# Patient Record
Sex: Male | Born: 1990 | Race: Black or African American | Hispanic: No | Marital: Married | Smoking: Never smoker
Health system: Southern US, Community
[De-identification: ages and names within clinical notes are randomized; demographics above are authoritative.]

---

## 2015-03-28 ENCOUNTER — Emergency Department (INDEPENDENT_AMBULATORY_CARE_PROVIDER_SITE_OTHER)
Admission: EM | Admit: 2015-03-28 | Discharge: 2015-03-28 | Disposition: A | Discharge status: Home / Self Care | Payer: Self-pay | Source: Home / Self Care | Attending: Family Medicine | Admitting: Family Medicine

## 2015-03-28 ENCOUNTER — Encounter (HOSPITAL_COMMUNITY): Payer: Self-pay | Admitting: *Deleted

## 2015-03-28 DIAGNOSIS — K5900 Constipation, unspecified: Secondary | ICD-10-CM

## 2015-03-28 LAB — POCT URINALYSIS DIP (DEVICE)
Bilirubin Urine: NEGATIVE
Glucose, UA: NEGATIVE mg/dL
Hgb urine dipstick: NEGATIVE
Ketones, ur: NEGATIVE mg/dL
Leukocytes, UA: NEGATIVE
Nitrite: NEGATIVE
Protein, ur: NEGATIVE mg/dL
Specific Gravity, Urine: 1.02 (ref 1.005–1.030)
Urobilinogen, UA: 4 mg/dL — ABNORMAL HIGH (ref 0.0–1.0)
pH: 8.5 — ABNORMAL HIGH (ref 5.0–8.0)

## 2015-03-28 MED ORDER — SENNOSIDES-DOCUSATE SODIUM 8.6-50 MG PO TABS: 1.0000 | ORAL_TABLET | Freq: Every evening | ORAL | Status: DC | PRN

## 2015-03-28 NOTE — Discharge Instructions (Signed)

## 2015-03-28 NOTE — ED Provider Notes (Signed)
CSN: [id number]     Arrival date & time 03/28/15  1619 History   First MD Initiated Contact with Patient 03/28/15 1642     Chief Complaint  Patient presents with  . Dysuria   (Consider location/radiation/quality/duration/timing/severity/associated sxs/prior Treatment) Patient is a 24 y.o. male presenting with dysuria. The history is provided by the patient. The history is limited by the absence of a caregiver and a language barrier. A language interpreter was used.  Dysuria This is a new problem. The current episode started more than 2 days ago. The problem occurs constantly. The problem has not changed since onset.Associated symptoms include abdominal pain. Nothing aggravates the symptoms. Nothing relieves the symptoms. He has tried nothing for the symptoms.   this is a 24 year old gentleman, married, who came over to the [place] from [place] 5 days ago. He is complaining about upper abdominal pain, constipation, and he thinks he has a urine problem because his urine is yellow. He has not been with any other sexual partner except his wife.  History reviewed. No pertinent past medical history. History reviewed. No pertinent past surgical history. History reviewed. No pertinent family history. Social History  Substance Use Topics  . Smoking status: None  . Smokeless tobacco: None  . Alcohol Use: No    Review of Systems  Constitutional: Negative.   HENT: Negative.   Eyes: Negative.   Respiratory: Negative.   Cardiovascular: Negative.   Gastrointestinal: Positive for abdominal pain.  Genitourinary: Positive for dysuria.    Allergies  Review of patient's allergies indicates no known allergies.  Home Medications   Prior to Admission medications   Not on File   Meds Ordered and Administered this Visit  Medications - No data to display  There were no vitals taken for this visit. No data found.   Physical Exam  Constitutional: He is oriented to person, place, and time.  He appears well-developed and well-nourished.  HENT:  Head: Normocephalic and atraumatic.  Right Ear: External ear normal.  Left Ear: External ear normal.  Mouth/Throat: Oropharynx is clear and moist.  Eyes: Conjunctivae and EOM are normal. Pupils are equal, round, and reactive to light.  Neck: Normal range of motion. Neck supple.  Cardiovascular: Normal rate, regular rhythm and normal heart sounds.   Pulmonary/Chest: Effort normal and breath sounds normal.  Abdominal: Soft. Bowel sounds are normal. He exhibits no distension. There is no tenderness. There is no rebound and no guarding.  I'm able to palpate the entire abdomen without noticing any grimacing from the patient. When asked if he has pain with palpation, patient does occasionally 0.2 both upper quadrants.  Musculoskeletal: Normal range of motion.  Lymphadenopathy:    He has no cervical adenopathy.  Neurological: He is alert and oriented to person, place, and time.  Skin: Skin is warm and dry.  Psychiatric: He has a normal mood and affect. His behavior is normal.  Nursing note and vitals reviewed.   ED Course  Procedures (including critical care time)  Labs Review Results for orders placed or performed during the hospital encounter of 03/28/15  POCT urinalysis dip (device)  Result Value Ref Range   Glucose, UA NEGATIVE NEGATIVE mg/dL   Bilirubin Urine NEGATIVE NEGATIVE   Ketones, ur NEGATIVE NEGATIVE mg/dL   Specific Gravity, Urine 1.020 1.005 - 1.030   Hgb urine dipstick NEGATIVE NEGATIVE   pH 8.5 (H) 5.0 - 8.0   Protein, ur NEGATIVE NEGATIVE mg/dL   Urobilinogen, UA 4.0 (H) 0.0 - 1.0 mg/dL  Nitrite NEGATIVE NEGATIVE   Leukocytes, UA NEGATIVE NEGATIVE       MDM  Assessment:  Mild dehydration and constipation.  Plan:  Increase fluids, Senokot bid.  Elvina Sidle, MD   Elvina Sidle, MD 03/28/15 1728

## 2015-03-28 NOTE — ED Notes (Signed)
Pt  Reports  Abnormal    Urination  And  Constipation          C/o  Low  abd  Pain    X  5  Days   No         Trouble  Breathing   Pt  Sitting  Upright  On  Exam table  Speaking  In  Complete  sentances    [employer]  Interpretors  Utilized

## 2015-04-13 ENCOUNTER — Telehealth (HOSPITAL_COMMUNITY): Payer: Self-pay

## 2015-05-17 ENCOUNTER — Emergency Department (INDEPENDENT_AMBULATORY_CARE_PROVIDER_SITE_OTHER)
Admission: EM | Admit: 2015-05-17 | Discharge: 2015-05-17 | Disposition: A | Discharge status: Home / Self Care | Payer: Medicaid Other | Source: Home / Self Care | Attending: Emergency Medicine | Admitting: Emergency Medicine

## 2015-05-17 ENCOUNTER — Other Ambulatory Visit (HOSPITAL_COMMUNITY)
Admission: RE | Admit: 2015-05-17 | Discharge: 2015-05-17 | Disposition: A | Discharge status: Home / Self Care | Payer: Medicaid Other | Source: Ambulatory Visit | Attending: Emergency Medicine | Admitting: Emergency Medicine

## 2015-05-17 ENCOUNTER — Encounter (HOSPITAL_COMMUNITY): Payer: Self-pay | Admitting: Emergency Medicine

## 2015-05-17 DIAGNOSIS — R0789 Other chest pain: Secondary | ICD-10-CM

## 2015-05-17 DIAGNOSIS — Z113 Encounter for screening for infections with a predominantly sexual mode of transmission: Secondary | ICD-10-CM | POA: Insufficient documentation

## 2015-05-17 DIAGNOSIS — R0781 Pleurodynia: Secondary | ICD-10-CM | POA: Diagnosis not present

## 2015-05-17 DIAGNOSIS — R8290 Unspecified abnormal findings in urine: Secondary | ICD-10-CM

## 2015-05-17 LAB — POCT I-STAT, CHEM 8
BUN: 6 mg/dL (ref 6–20)
Calcium, Ion: 1.3 mmol/L — ABNORMAL HIGH (ref 1.12–1.23)
Chloride: 100 mmol/L — ABNORMAL LOW (ref 101–111)
Creatinine, Ser: 0.6 mg/dL — ABNORMAL LOW (ref 0.61–1.24)
GLUCOSE: 81 mg/dL (ref 65–99)
HEMATOCRIT: 53 % — AB (ref 39.0–52.0)
HEMOGLOBIN: 18 g/dL — AB (ref 13.0–17.0)
POTASSIUM: 4.2 mmol/L (ref 3.5–5.1)
SODIUM: 141 mmol/L (ref 135–145)
TCO2: 28 mmol/L (ref 0–100)

## 2015-05-17 LAB — POCT URINALYSIS DIP (DEVICE)
BILIRUBIN URINE: NEGATIVE
GLUCOSE, UA: NEGATIVE mg/dL
Hgb urine dipstick: NEGATIVE
Ketones, ur: NEGATIVE mg/dL
LEUKOCYTES UA: NEGATIVE
NITRITE: NEGATIVE
Protein, ur: NEGATIVE mg/dL
Specific Gravity, Urine: 1.015 (ref 1.005–1.030)
UROBILINOGEN UA: 2 mg/dL — AB (ref 0.0–1.0)
pH: 8.5 — ABNORMAL HIGH (ref 5.0–8.0)

## 2015-05-17 MED ORDER — CEFTRIAXONE SODIUM 250 MG IJ SOLR
250.0000 mg | Freq: Once | INTRAMUSCULAR | Status: AC
  Administered 2015-05-17: 250 mg via INTRAMUSCULAR

## 2015-05-17 MED ORDER — LIDOCAINE HCL (PF) 1 % IJ SOLN
INTRAMUSCULAR | Status: AC
  Filled 2015-05-17: qty 5

## 2015-05-17 MED ORDER — CEFTRIAXONE SODIUM 250 MG IJ SOLR
INTRAMUSCULAR | Status: AC
  Filled 2015-05-17: qty 250

## 2015-05-17 MED ORDER — MELOXICAM 15 MG PO TABS: 15.0000 mg | ORAL_TABLET | Freq: Every day | ORAL | Status: DC

## 2015-05-17 MED ORDER — AZITHROMYCIN 250 MG PO TABS
ORAL_TABLET | ORAL | Status: AC
  Filled 2015-05-17: qty 4

## 2015-05-17 MED ORDER — AZITHROMYCIN 250 MG PO TABS
1000.0000 mg | ORAL_TABLET | Freq: Once | ORAL | Status: AC
  Administered 2015-05-17: 1000 mg via ORAL

## 2015-05-17 NOTE — ED Notes (Signed)
C/o left side flank pain and mid chest pain for a week now States urine color is dark Denies any spicy food eating

## 2015-05-17 NOTE — ED Provider Notes (Addendum)
CSN: [id number]     Arrival date & time 05/17/15  1447 History   First MD Initiated Contact with Patient 05/17/15 1545     Chief Complaint  Patient presents with  . Flank Pain  . Chest Pain   (Consider location/radiation/quality/duration/timing/severity/associated sxs/prior Treatment) HPI  He is a 24 year old man here for evaluation of chest pain and right side pain. [place] 2 [place] ago. He declines an interpreter today.  He reports a 2 week history of central chest pain. He states it is present every day. Nothing makes it better or worse. It is not triggered by activity or foods. He does report some palpitations. He denies any shortness of breath. No injury or trauma. He also reports a three-week history of right side pain. It is located over his lateral ribs. It is present only at night when he lays down to sleep. He states it hurts when he tries to change positions. He also reports dark urine for the last several months.  He does report some dysuria.  No fevers or chills. No abdominal pain. No nausea or vomiting.   History reviewed. No pertinent past medical history. History reviewed. No pertinent past surgical history. History reviewed. No pertinent family history. Social History  Substance Use Topics  . Smoking status: None  . Smokeless tobacco: None  . Alcohol Use: No    Review of Systems As in history of present illness Allergies  Review of patient's allergies indicates no known allergies.  Home Medications   Prior to Admission medications   Medication Sig Start Date End Date Taking? Authorizing Provider  meloxicam (MOBIC) 15 MG tablet Take 1 tablet (15 mg total) by mouth daily. For 2 weeks, then as needed for pain 05/17/15   Charm RingsErin J Montay Vanvoorhis, MD  senna-docusate (SENOKOT-S) 8.6-50 MG tablet Take 1 tablet by mouth at bedtime as needed for mild constipation. 03/28/15   Elvina SidleKurt Lauenstein, MD   Meds Ordered and Administered this Visit   Medications  cefTRIAXone  (ROCEPHIN) injection 250 mg (not administered)  azithromycin (ZITHROMAX) tablet 1,000 mg (not administered)    BP 112/81 mmHg  Pulse 64  Temp(Src) 98.6 F (37 C) (Oral)  Resp 16  SpO2 100% No data found.   Physical Exam  Constitutional: He is oriented to person, place, and time. He appears well-developed and well-nourished. No distress.  HENT:  Mouth/Throat: Oropharynx is clear and moist.  Neck: Neck supple.  Cardiovascular: Normal rate and regular rhythm.   No murmur heard. Pulmonary/Chest: Effort normal. No respiratory distress. He has no wheezes. He has no rales. He exhibits no tenderness.  Abdominal: Soft.  No CVA tenderness  Musculoskeletal:  He is tender to palpation along the right lateral ribs  Neurological: He is alert and oriented to person, place, and time.    ED Course  Procedures (including critical care time) ED ECG REPORT   Date: 05/17/2015  Rate: 69  Rhythm: normal sinus rhythm  QRS Axis: normal  Intervals: normal  ST/T Wave abnormalities: normal  Conduction Disutrbances:none  Narrative Interpretation: normal ekg  Old EKG Reviewed: none available  I have personally reviewed the EKG tracing and agree with the computerized printout as noted.   Labs Review Labs Reviewed  POCT URINALYSIS DIP (DEVICE) - Abnormal; Notable for the following:    pH 8.5 (*)    Urobilinogen, UA 2.0 (*)    All other components within normal limits  POCT I-STAT, CHEM 8 - Abnormal; Notable for the following:  Chloride 100 (*)    Creatinine, Ser 0.60 (*)    Calcium, Ion 1.30 (*)    Hemoglobin 18.0 (*)    HCT 53.0 (*)    All other components within normal limits  URINE CULTURE  URINE CYTOLOGY ANCILLARY ONLY    Imaging Review No results found.   MDM   1. Atypical chest pain   2. Rib pain on right side   3. Urine abnormality    History EKG are not concerning for cardiac etiology of chest pain. I suspect musculoskeletal. Rib pain is likely muscle skeletal given  tenderness to palpation. Treat with meloxicam daily for 2 weeks. If no improvement, he will follow-up at the emergency room.  On further history, he revealed that his urinary symptoms started after having sex with a different person. Will treat here with azithromycin and Rocephin. Urine has been sent for culture and cytology.    Charm Rings, MD 05/17/15 1610  Charm Rings, MD 05/17/15 [phone]

## 2015-05-17 NOTE — Discharge Instructions (Signed)
Your EKG shows your heart is normal. The pain in your chest and side is likely coming from the muscles and tendons in your rib cage. Take meloxicam 1 pill daily for 2 weeks. If this does not make it better, please go to the emergency room for additional evaluation.  We gave you some medicines here today to see if that will fix your urine problem.

## 2015-05-18 LAB — URINE CYTOLOGY ANCILLARY ONLY
Chlamydia: NEGATIVE
Neisseria Gonorrhea: NEGATIVE
TRICH (WINDOWPATH): NEGATIVE

## 2015-05-18 LAB — URINE CULTURE: Culture: NO GROWTH

## 2015-05-18 NOTE — ED Notes (Signed)
Final report urine C&S negative for infection. Still waiting for STD testing report

## 2015-05-18 NOTE — ED Notes (Signed)
Final report of STD testing negative 

## 2015-11-25 ENCOUNTER — Encounter (HOSPITAL_COMMUNITY): Payer: Self-pay | Admitting: Emergency Medicine

## 2015-11-25 ENCOUNTER — Ambulatory Visit (INDEPENDENT_AMBULATORY_CARE_PROVIDER_SITE_OTHER): Payer: Medicaid Other

## 2015-11-25 ENCOUNTER — Ambulatory Visit (HOSPITAL_COMMUNITY)
Admission: EM | Admit: 2015-11-25 | Discharge: 2015-11-25 | Disposition: A | Discharge status: Home / Self Care | Payer: Medicaid Other | Attending: Emergency Medicine | Admitting: Emergency Medicine

## 2015-11-25 DIAGNOSIS — R51 Headache: Secondary | ICD-10-CM

## 2015-11-25 DIAGNOSIS — R079 Chest pain, unspecified: Secondary | ICD-10-CM

## 2015-11-25 DIAGNOSIS — R519 Headache, unspecified: Secondary | ICD-10-CM

## 2015-11-25 LAB — POCT URINALYSIS DIP (DEVICE)
GLUCOSE, UA: NEGATIVE mg/dL
Hgb urine dipstick: NEGATIVE
KETONES UR: NEGATIVE mg/dL
Leukocytes, UA: NEGATIVE
NITRITE: NEGATIVE
PH: 7 (ref 5.0–8.0)
Protein, ur: NEGATIVE mg/dL
Specific Gravity, Urine: 1.025 (ref 1.005–1.030)
Urobilinogen, UA: 1 mg/dL (ref 0.0–1.0)

## 2015-11-25 NOTE — Discharge Instructions (Signed)
General Headache Without Cause A headache is pain or discomfort felt around the head or neck area. There are many causes and types of headaches. In some cases, the cause may not be found.  HOME CARE  Managing Pain  Take over-the-counter and prescription medicines only as told by your doctor.  Lie down in a dark, quiet room when you have a headache.  If directed, apply ice to the head and neck area:  Put ice in a plastic bag.  Place a towel between your skin and the bag.  Leave the ice on for 20 minutes, 2-3 times per day.  Use a heating pad or hot shower to apply heat to the head and neck area as told by your doctor.  Keep lights dim if bright lights bother you or make your headaches worse. Eating and Drinking  Eat meals on a regular schedule.  Lessen how much alcohol you drink.  Lessen how much caffeine you drink, or stop drinking caffeine. General Instructions  Keep all follow-up visits as told by your doctor. This is important.  Keep a journal to find out if certain things bring on headaches. For example, write down:  What you eat and drink.  How much sleep you get.  Any change to your diet or medicines.  Relax by getting a massage or doing other relaxing activities.  Lessen stress.  Sit up straight. Do not tighten (tense) your muscles.  Do not use tobacco products. This includes cigarettes, chewing tobacco, or e-cigarettes. If you need help quitting, ask your doctor.  Exercise regularly as told by your doctor.  Get enough sleep. This often means 7-9 hours of sleep. GET HELP IF:  Your symptoms are not helped by medicine.  You have a headache that feels different than the other headaches.  You feel sick to your stomach (nauseous) or you throw up (vomit).  You have a fever. GET HELP RIGHT AWAY IF:   Your headache becomes really bad.  You keep throwing up.  You have a stiff neck.  You have trouble seeing.  You have trouble speaking.  You have  pain in the eye or ear.  Your muscles are weak or you lose muscle control.  You lose your balance or have trouble walking.  You feel like you will pass out (faint) or you pass out.  You have confusion.   This information is not intended to replace advice given to you by your health care provider. Make sure you discuss any questions you have with your health care provider.   Document Released: 03/26/2008 Document Revised: 03/08/2015 Document Reviewed: 10/10/2014 Elsevier Interactive Patient Education 2016 Elsevier Inc[employer] Nonspecific Chest Pain  Chest pain can be caused by many different conditions. There is always a chance that your pain could be related to something serious, such as a heart attack or a blood clot in your lungs. Chest pain can also be caused by conditions that are not life-threatening. If you have chest pain, it is very important to follow up with your health care provider. CAUSES  Chest pain can be caused by:  Heartburn.  Pneumonia or bronchitis.  Anxiety or stress.  Inflammation around your heart (pericarditis) or lung (pleuritis or pleurisy).  A blood clot in your lung.  A collapsed lung (pneumothorax). It can develop suddenly on its own (spontaneous pneumothorax) or from trauma to the chest.  Shingles infection (varicella-zoster virus).  Heart attack.  Damage to the bones, muscles, and cartilage that make up your chest wall.  This can include:  Bruised bones due to injury.  Strained muscles or cartilage due to frequent or repeated coughing or overwork.  Fracture to one or more ribs.  Sore cartilage due to inflammation (costochondritis). RISK FACTORS  Risk factors for chest pain may include:  Activities that increase your risk for trauma or injury to your chest.  Respiratory infections or conditions that cause frequent coughing.  Medical conditions or overeating that can cause heartburn.  Heart disease or family history of heart  disease.  Conditions or health behaviors that increase your risk of developing a blood clot.  Having had chicken pox (varicella zoster). SIGNS AND SYMPTOMS Chest pain can feel like:  Burning or tingling on the surface of your chest or deep in your chest.  Crushing, pressure, aching, or squeezing pain.  Dull or sharp pain that is worse when you move, cough, or take a deep breath.  Pain that is also felt in your back, neck, shoulder, or arm, or pain that spreads to any of these areas. Your chest pain may come and go, or it may stay constant. DIAGNOSIS Lab tests or other studies may be needed to find the cause of your pain. Your health care provider may have you take a test called an ambulatory ECG (electrocardiogram). An ECG records your heartbeat patterns at the time the test is performed. You may also have other tests, such as:  Transthoracic echocardiogram (TTE). During echocardiography, sound waves are used to create a picture of all of the heart structures and to look at how blood flows through your heart.  Transesophageal echocardiogram (TEE).This is a more advanced imaging test that obtains images from inside your body. It allows your health care provider to see your heart in finer detail.  Cardiac monitoring. This allows your health care provider to monitor your heart rate and rhythm in real time.  Holter monitor. This is a portable device that records your heartbeat and can help to diagnose abnormal heartbeats. It allows your health care provider to track your heart activity for several days, if needed.  Stress tests. These can be done through exercise or by taking medicine that makes your heart beat more quickly.  Blood tests.  Imaging tests. TREATMENT  Your treatment depends on what is causing your chest pain. Treatment may include:  Medicines. These may include:  Acid blockers for heartburn.  Anti-inflammatory medicine.  Pain medicine for inflammatory  conditions.  Antibiotic medicine, if an infection is present.  Medicines to dissolve blood clots.  Medicines to treat coronary artery disease.  Supportive care for conditions that do not require medicines. This may include:  Resting.  Applying heat or cold packs to injured areas.  Limiting activities until pain decreases. HOME CARE INSTRUCTIONS  If you were prescribed an antibiotic medicine, finish it all even if you start to feel better.  Avoid any activities that bring on chest pain.  Do not use any tobacco products, including cigarettes, chewing tobacco, or electronic cigarettes. If you need help quitting, ask your health care provider.  Do not drink alcohol.  Take medicines only as directed by your health care provider.  Keep all follow-up visits as directed by your health care provider. This is important. This includes any further testing if your chest pain does not go away.  If heartburn is the cause for your chest pain, you may be told to keep your head raised (elevated) while sleeping. This reduces the chance that acid will go from your stomach into your  esophagus.  Make lifestyle changes as directed by your health care provider. These may include:  Getting regular exercise. Ask your health care provider to suggest some activities that are safe for you.  Eating a heart-healthy diet. A registered dietitian can help you to learn healthy eating options.  Maintaining a healthy weight.  Managing diabetes, if necessary.  Reducing stress. SEEK MEDICAL CARE IF:  Your chest pain does not go away after treatment.  You have a rash with blisters on your chest.  You have a fever. SEEK IMMEDIATE MEDICAL CARE IF:   Your chest pain is worse.  You have an increasing cough, or you cough up blood.  You have severe abdominal pain.  You have severe weakness.  You faint.  You have chills.  You have sudden, unexplained chest discomfort.  You have sudden, unexplained  discomfort in your arms, back, neck, or jaw.  You have shortness of breath at any time.  You suddenly start to sweat, or your skin gets clammy.  You feel nauseous or you vomit.  You suddenly feel light-headed or dizzy.  Your heart begins to beat quickly, or it feels like it is skipping beats. These symptoms may represent a serious problem that is an emergency. Do not wait to see if the symptoms will go away. Get medical help right away. Call your local emergency services (911 in the [place] Do not drive yourself to the hospital.   This information is not intended to replace advice given to you by your health care provider. Make sure you discuss any questions you have with your health care provider.   Document Released: 03/27/2005 Document Revised: 07/08/2014 Document Reviewed: 01/21/2014 Elsevier Interactive Patient Education [employer]

## 2015-11-25 NOTE — ED Provider Notes (Signed)
CSN: [id number]     Arrival date & time 11/25/15  1321 History   First MD Initiated Contact with Patient 11/25/15 1442     Chief Complaint  Patient presents with  . Chest Pain   (Consider location/radiation/quality/duration/timing/severity/associated sxs/prior Treatment) HPI  History obtained from patient:  Pt presents with the cc of:  Headache, chest pain  Duration of symptoms: 2 weeks, or more Treatment prior to arrival: No treatment prior to arrival Context: Patient states that he has had a headache and chest pain off and on for the past couple weeks and possibly a bit longer. He has sought no treatment for this in the past and has not taken any medication. He denies any triggers for the chest pain or the headache. Denies any neurological symptoms at this time. Other symptoms include: None Pain score: 2 FAMILY HISTORY: Not discussed because of language issues      History reviewed. No pertinent past medical history. History reviewed. No pertinent past surgical history. No family history on file. Social History  Substance Use Topics  . Smoking status: Never Smoker   . Smokeless tobacco: None  . Alcohol Use: Yes    Review of Systems  Denies:  NAUSEA, ABDOMINAL PAIN, CHEST PAIN, CONGESTION, DYSURIA, SHORTNESS OF BREATH  Allergies  Review of patient's allergies indicates no known allergies.  Home Medications   Prior to Admission medications   Medication Sig Start Date End Date Taking? Authorizing Provider  meloxicam (MOBIC) 15 MG tablet Take 1 tablet (15 mg total) by mouth daily. For 2 weeks, then as needed for pain 05/17/15   Charm Rings, MD  senna-docusate (SENOKOT-S) 8.6-50 MG tablet Take 1 tablet by mouth at bedtime as needed for mild constipation. 03/28/15   Elvina Sidle, MD   Meds Ordered and Administered this Visit  Medications - No data to display  BP 125/73 mmHg  Pulse 79  Temp(Src) 98.4 F (36.9 C) (Oral)  Resp 18  SpO2 99% No data found.   Physical  Exam NURSES NOTES AND VITAL SIGNS REVIEWED. CONSTITUTIONAL: Well developed, well nourished, no acute distress HEENT: normocephalic, atraumatic EYES: Conjunctiva normal NECK:normal ROM, supple, no adenopathy PULMONARY:No respiratory distress, normal effort ABDOMINAL: Soft, ND, NT BS+, No CVAT MUSCULOSKELETAL: Normal ROM of all extremities,  SKIN: warm and dry without rash PSYCHIATRIC: Mood and affect, behavior are normal  ED Course  Procedures (including critical care time)  Labs Review Labs Reviewed  POCT URINALYSIS DIP (DEVICE) - Abnormal; Notable for the following:    Bilirubin Urine SMALL (*)    All other components within normal limits   I HAVE PERSONALLY REVIEWED TESTS RESULTS WITH PATIENT.  Imaging Review No results found. There is a delay in review of x-rays at this time.  Pt is advised that if there is a finding that requires action he will be contacted.     Visual Acuity Review  Right Eye Distance:   Left Eye Distance:   Bilateral Distance:    Right Eye Near:   Left Eye Near:    Bilateral Near:      I HAVE PERSONALLY REVIEWED ECG AND DISCUSSED FINDINGS WITH PATIENT.   PA UC INTERPRETATION: NORMAL SINUS RHYTHM WITH RATE OF 63, NO AXIS, OR INTERVAL ABNORMALITIES. NO ACUTE CHANGES.  NO OLD ECG TO COMPARE.  ECG UNCHANGED AS COMPARED TO: 11-16    MDM   1. Nonintractable headache, unspecified chronicity pattern, unspecified headache type   2. Chest pain, unspecified chest pain type  Patient is reassured that there are no issues that require transfer to higher level of care at this time or additional tests. Patient is advised to continue home symptomatic treatment. Patient is advised that if there are new or worsening symptoms to attend the emergency department, contact primary care provider, or return to UC. Instructions of care provided discharged home in stable condition.    THIS NOTE WAS GENERATED USING A VOICE RECOGNITION SOFTWARE PROGRAM. ALL  REASONABLE EFFORTS  WERE MADE TO PROOFREAD THIS DOCUMENT FOR ACCURACY.  I have verbally reviewed the discharge instructions with the patient. A printed AVS was given to the patient.  All questions were answered prior to discharge.      Tharon AquasFrank C Shalah Estelle, PA 11/25/15 1606

## 2015-11-25 NOTE — ED Notes (Signed)
Pt c/o constant CP x2 weeks (7/10), constant HA (9/10) and constipation x3 days.... Also reports urine has been more yellowish/amber than usual A&O x4... No acute distress.

## 2016-04-04 ENCOUNTER — Encounter (HOSPITAL_COMMUNITY): Payer: Self-pay | Admitting: Emergency Medicine

## 2016-04-04 ENCOUNTER — Ambulatory Visit (HOSPITAL_COMMUNITY)
Admission: EM | Admit: 2016-04-04 | Discharge: 2016-04-04 | Disposition: A | Discharge status: Home / Self Care | Payer: Medicaid Other | Attending: Internal Medicine | Admitting: Internal Medicine

## 2016-04-04 DIAGNOSIS — N489 Disorder of penis, unspecified: Secondary | ICD-10-CM | POA: Diagnosis not present

## 2016-04-04 DIAGNOSIS — Q809 Congenital ichthyosis, unspecified: Secondary | ICD-10-CM | POA: Diagnosis not present

## 2016-04-04 DIAGNOSIS — N4889 Other specified disorders of penis: Principal | ICD-10-CM

## 2016-04-04 DIAGNOSIS — G8929 Other chronic pain: Secondary | ICD-10-CM

## 2016-04-04 DIAGNOSIS — L299 Pruritus, unspecified: Secondary | ICD-10-CM

## 2016-04-04 MED ORDER — TRIAMCINOLONE ACETONIDE 0.1 % EX CREA: 1.0000 "application " | TOPICAL_CREAM | Freq: Two times a day (BID) | CUTANEOUS | 0 refills | Status: DC

## 2016-04-04 NOTE — Discharge Instructions (Signed)
You may need to see a specialist to understand why you are having pain in your penis.   You have very dry skin you need to use "GOLD BOND" skin therapy lotion.   Use medication as prescribed for you

## 2016-04-04 NOTE — ED Provider Notes (Signed)
CSN: [id number]     Arrival date & time 04/04/16  1456 History   First MD Initiated Contact with Patient 04/04/16 1623     No chief complaint on file.  (Consider location/radiation/quality/duration/timing/severity/associated sxs/prior Treatment) HPI This is not a new problem.  Pt is complaining of pain with urination. This has been an ongoing for over 1 year. He has a burning pain. States that he has pain with sex. There are no alleviating factors. He has pain every day. States that  He also has itching skin that he has used a cream for but it makes his symptoms worse.  No past medical history on file. No past surgical history on file. No family history on file. Social History  Substance Use Topics  . Smoking status: Never Smoker  . Smokeless tobacco: Not on file  . Alcohol use Yes    Review of Systems  Denies: HEADACHE, NAUSEA, ABDOMINAL PAIN, CHEST PAIN, CONGESTION, DYSURIA, SHORTNESS OF BREATH  Allergies  Review of patient's allergies indicates no known allergies.  Home Medications   Prior to Admission medications   Medication Sig Start Date End Date Taking? Authorizing Provider  meloxicam (MOBIC) 15 MG tablet Take 1 tablet (15 mg total) by mouth daily. For 2 weeks, then as needed for pain 05/17/15   Charm RingsErin J Honig, MD  senna-docusate (SENOKOT-S) 8.6-50 MG tablet Take 1 tablet by mouth at bedtime as needed for mild constipation. 03/28/15   Elvina SidleKurt Lauenstein, MD   Meds Ordered and Administered this Visit  Medications - No data to display  BP 115/65 (BP Location: Left Arm)   Pulse 60   Temp 98.6 F (37 C) (Oral)   Resp 12   SpO2 100%  No data found.   Physical Exam NURSES NOTES AND VITAL SIGNS REVIEWED. CONSTITUTIONAL: Well developed, well nourished, no acute distress HEENT: normocephalic, atraumatic EYES: Conjunctiva normal NECK:normal ROM, supple, no adenopathy PULMONARY:No respiratory distress, normal effort ABDOMINAL: Soft, ND, NT BS+, No CVAT MUSCULOSKELETAL:  Normal ROM of all extremities,  SKIN: warm and dry without rash PSYCHIATRIC: Mood and affect, behavior are normal GENITALIA: NORMAL EXTERNAL MALE PENIS AND SCROTUM.  NO VISIBLE OR PALPABLE LESIONS.   Urgent Care Course   Clinical Course    Procedures (including critical care time)  Labs Review Labs Reviewed - No data to display  Imaging Review No results found.   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 Itchy skin   3. Xeroderma      Patient is reassured that there are no issues that require transfer to higher level of care at this time or additional tests. Patient is advised to continue home symptomatic treatment. Patient is advised that if there are new or worsening symptoms to attend the emergency department, contact primary care provider, or return to UC. Instructions of care provided discharged home in stable condition.    THIS NOTE WAS GENERATED USING A VOICE RECOGNITION SOFTWARE PROGRAM. ALL REASONABLE EFFORTS  WERE MADE TO PROOFREAD THIS DOCUMENT FOR ACCURACY.  I have verbally reviewed the discharge instructions with the patient. A printed AVS was given to the patient.  All questions were answered prior to discharge.      Tharon AquasFrank C Patrick, PA 04/04/16 2121

## 2016-04-04 NOTE — ED Triage Notes (Signed)
Patient has burning with urination.  Patient has been seen for the same prior to now at [facility].  Patient also complains of a itchiness to groin area

## 2016-04-04 NOTE — ED Notes (Signed)
Patient and wife are being treated in the same treatment room

## 2016-04-29 ENCOUNTER — Emergency Department (HOSPITAL_COMMUNITY): Payer: Medicaid Other

## 2016-04-29 ENCOUNTER — Emergency Department (HOSPITAL_COMMUNITY)
Admission: EM | Admit: 2016-04-29 | Discharge: 2016-04-29 | Disposition: A | Discharge status: Home / Self Care | Payer: Medicaid Other | Attending: Emergency Medicine | Admitting: Emergency Medicine

## 2016-04-29 ENCOUNTER — Encounter (HOSPITAL_COMMUNITY): Payer: Self-pay

## 2016-04-29 DIAGNOSIS — R0789 Other chest pain: Secondary | ICD-10-CM | POA: Diagnosis not present

## 2016-04-29 DIAGNOSIS — N4889 Other specified disorders of penis: Secondary | ICD-10-CM | POA: Insufficient documentation

## 2016-04-29 DIAGNOSIS — R079 Chest pain, unspecified: Secondary | ICD-10-CM | POA: Diagnosis present

## 2016-04-29 LAB — URINALYSIS, ROUTINE W REFLEX MICROSCOPIC
BILIRUBIN URINE: NEGATIVE
GLUCOSE, UA: NEGATIVE mg/dL
HGB URINE DIPSTICK: NEGATIVE
KETONES UR: NEGATIVE mg/dL
Leukocytes, UA: NEGATIVE
Nitrite: NEGATIVE
PROTEIN: NEGATIVE mg/dL
Specific Gravity, Urine: 1.019 (ref 1.005–1.030)
pH: 8 (ref 5.0–8.0)

## 2016-04-29 LAB — BASIC METABOLIC PANEL
Anion gap: 8 (ref 5–15)
BUN: 5 mg/dL — AB (ref 6–20)
CO2: 21 mmol/L — ABNORMAL LOW (ref 22–32)
CREATININE: 0.46 mg/dL — AB (ref 0.61–1.24)
Calcium: 9.1 mg/dL (ref 8.9–10.3)
Chloride: 109 mmol/L (ref 101–111)
GFR calc Af Amer: >60 mL/min (ref >60)
Glucose, Bld: 80 mg/dL (ref 65–99)
Potassium: 4.4 mmol/L (ref 3.5–5.1)
SODIUM: 138 mmol/L (ref 135–145)

## 2016-04-29 LAB — I-STAT TROPONIN, ED: TROPONIN I, POC: 0 ng/mL (ref 0.00–0.08)

## 2016-04-29 LAB — CBC
HCT: 45.7 % (ref 39.0–52.0)
Hemoglobin: 15.6 g/dL (ref 13.0–17.0)
MCH: 29 pg (ref 26.0–34.0)
MCHC: 34.1 g/dL (ref 30.0–36.0)
MCV: 84.9 fL (ref 78.0–100.0)
PLATELETS: 174 10*3/uL (ref 150–400)
RBC: 5.38 MIL/uL (ref 4.22–5.81)
RDW: 13.5 % (ref 11.5–15.5)
WBC: 3.7 10*3/uL — AB (ref 4.0–10.5)

## 2016-04-29 MED ORDER — NAPROXEN 500 MG PO TABS: 500.0000 mg | ORAL_TABLET | Freq: Two times a day (BID) | ORAL | 0 refills | Status: DC

## 2016-04-29 NOTE — ED Provider Notes (Addendum)
[facility] Provider Note   CSN: [id number] Arrival date & time: 04/29/16  1108     History   Chief Complaint Chief Complaint  Patient presents with  . Chest Pain    HPI Kyle Morris is a 25 y.o. male.  HPI Kyle Morris is a 25 y.o. male presents to emergency department complaining of chest pain and penile pain.  Patient states all symptoms have been going on for months. Pt report chest pain and penile pain during intercourse.  He also states chest pain is tender to the touch.  He has been to the urgent care several times for this, was referred to a primary care doctor, however he states "they do not take Medicaid so I cant follow up."  Patient denies any shortness of breath.  He denies any injuries.  He denies any swelling to his penis or scrotum.  He states he has pain during urination.  He states "my urine looks yellow."  He states his urine is still look white.  He denies any penile discharge.  He denies any abdominal pain, back pain, nausea, vomiting, fever, chills.  He denies any cough.  No recent travel or surgeries.   History reviewed. No pertinent past medical history.  There are no active problems to display for this patient.   History reviewed. No pertinent surgical history.     Home Medications    Prior to Admission medications   Medication Sig Start Date End Date Taking? Authorizing Provider  meloxicam (MOBIC) 15 MG tablet Take 1 tablet (15 mg total) by mouth daily. For 2 weeks, then as needed for pain Patient not taking: Reported on 04/29/2016 05/17/15   Charm RingsErin J Honig, MD  senna-docusate (SENOKOT-S) 8.6-50 MG tablet Take 1 tablet by mouth at bedtime as needed for mild constipation. Patient not taking: Reported on 04/29/2016 03/28/15   Elvina SidleKurt Lauenstein, MD  triamcinolone cream (KENALOG) 0.1 % Apply 1 application topically 2 (two) times daily. Patient not taking: Reported on 04/29/2016 04/04/16   Tharon AquasFrank C Patrick, PA    Family History No  family history on file.  Social History Social History  Substance Use Topics  . Smoking status: Never Smoker  . Smokeless tobacco: Never Used  . Alcohol use Yes     Allergies   Review of patient's allergies indicates no known allergies.   Review of Systems Review of Systems  Constitutional: Negative for chills and fever.  Respiratory: Negative for cough, chest tightness and shortness of breath.   Cardiovascular: Positive for chest pain. Negative for palpitations and leg swelling.  Gastrointestinal: Negative for abdominal distention, abdominal pain, diarrhea, nausea and vomiting.  Genitourinary: Positive for penile pain. Negative for dysuria, frequency, hematuria and urgency.  Musculoskeletal: Negative for arthralgias, myalgias, neck pain and neck stiffness.  Skin: Negative for rash.  Allergic/Immunologic: Negative for immunocompromised state.  Neurological: Negative for dizziness, weakness, light-headedness, numbness and headaches.  All other systems reviewed and are negative.    Physical Exam Updated Vital Signs BP 107/81   Pulse (!) 57   Temp 98 F (36.7 C)   Resp 11   SpO2 100%   Physical Exam  Constitutional: He appears well-developed and well-nourished. No distress.  HENT:  Head: Normocephalic and atraumatic.  Eyes: Conjunctivae are normal.  Neck: Neck supple.  Cardiovascular: Normal rate, regular rhythm and normal heart sounds.   Pulmonary/Chest: Effort normal. No respiratory distress. He has no wheezes. He has no rales. He exhibits tenderness.  Diffuse tenderness to palpation  Abdominal: Soft.  Bowel sounds are normal. He exhibits no distension. There is no tenderness. There is no rebound.  Genitourinary: Penis normal. No penile tenderness.  Genitourinary Comments: Normal scrotum bilaterally  Musculoskeletal: He exhibits no edema.  Neurological: He is alert.  Skin: Skin is warm and dry.  Nursing note and vitals reviewed.    ED Treatments / Results    Labs (all labs ordered are listed, but only abnormal results are displayed) Labs Reviewed  BASIC METABOLIC PANEL - Abnormal; Notable for the following:       Result Value   CO2 21 (*)    BUN 5 (*)    Creatinine, Ser 0.46 (*)    All other components within normal limits  CBC - Abnormal; Notable for the following:    WBC 3.7 (*)    All other components within normal limits  URINALYSIS, ROUTINE W REFLEX MICROSCOPIC (NOT AT [facility])  I-STAT TROPOININ, ED  GC/CHLAMYDIA PROBE AMP ([place]) NOT AT [facility]    EKG  EKG Interpretation  Date/Time:  Monday April 29 2016 11:15:05 EDT Ventricular Rate:  79 PR Interval:  150 QRS Duration: 80 QT Interval:  336 QTC Calculation: 385 R Axis:   74 Text Interpretation:  Normal sinus rhythm Normal ECG Confirmed by HAVILAND MD, JULIE ([id number]) on 04/29/2016 12:18:40 PM       Radiology Dg Chest 2 View  Result Date: 04/29/2016 CLINICAL DATA:  Chest pain EXAM: CHEST  2 VIEW COMPARISON:  5/ 27/17 FINDINGS: The heart size and mediastinal contours are within normal limits. Both lungs are clear. The visualized skeletal structures are unremarkable. IMPRESSION: No active cardiopulmonary disease. Electronically Signed   By: Natasha MeadLiviu  Pop M.D.   On: 04/29/2016 12:03    Procedures Procedures (including critical care time)  Medications Ordered in ED Medications - No data to display   Initial Impression / Assessment and Plan / ED Course  I have reviewed the triage vital signs and the nursing notes.  Pertinent labs & imaging results that were available during my care of the patient were reviewed by me and considered in my medical decision making (see chart for details).  Clinical Course   Pt in ED with recurrent chronic chest wall pain. He is in nad. Chest pain reproducible. [facility] Will check basic labs, CXR. PERC negatie, doubt PE. Normal VS.   Pt also complaining of pain to his penis. This is also a chronic  complaint. Pt reports dysuria. Pain with intercourse. Will chec UA and GC/Chlamidia sent.   2:36 PM Pt's chest x-ray is negative.  Most likely chest wall pain.  Doubt PE or ACS. His genital exam is normal with no penile lesions, discharge, no scrotal pain or swelling at this time. Pt continues to complain of pain to his penis.  Will refer to urology. Naprosyn for pain and inflammation of his chest wall pain.   Follow up with PCP.   Vitals:   04/29/16 1112 04/29/16 1230 04/29/16 1245 04/29/16 1330  BP: 132/88 105/81 106/79 107/81  Pulse: 75 (!) 58 64 (!) 57  Resp: 17 13 11 11   Temp: 98 F (36.7 C)     SpO2: 100% 100% 100% 100%     Final Clinical Impressions(s) / ED Diagnoses   Final diagnoses:  Chest wall pain  Penile pain    New Prescriptions New Prescriptions   No medications on file     Jaynie Crumbleatyana Tali Cleaves, PA-C 05/01/16 [id number]  Jacalyn Lefevre, MD 05/01/16 1554    Jaynie Crumble, PA-C 05/23/16 1204    Jacalyn Lefevre, MD 05/24/16 [phone]

## 2016-04-29 NOTE — ED Notes (Signed)
Patient undressed, in gown, on monitor, continuous pulse oximetry and blood pressure cuff 

## 2016-04-29 NOTE — Discharge Instructions (Signed)
Naprosyn for chest wall pain. Follow up with primary care doctor. Please follow up with urology for further evaluation of your penile pain

## 2016-04-29 NOTE — ED Notes (Signed)
PA at bedside.

## 2016-04-29 NOTE — ED Triage Notes (Addendum)
Per pt, Pt is coming from home with complaints of central chest pain that started two weeks ago. Pt reports burning chest pain with some intermittent sharp pains. Complains of headache intermittently. 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 Was not able to see specialist due to insurance.

## 2016-04-29 NOTE — ED Notes (Signed)
Pt.  Verbalize understanding of discharge teaching and need to follow up. Pt. States understanding of primary/ specialty doctor number to call for follow up.

## 2016-04-30 LAB — GC/CHLAMYDIA PROBE AMP (~~LOC~~) NOT AT ARMC
Chlamydia: NEGATIVE
NEISSERIA GONORRHEA: NEGATIVE

## 2017-05-21 ENCOUNTER — Encounter (HOSPITAL_COMMUNITY): Payer: Self-pay | Admitting: Emergency Medicine

## 2017-05-21 ENCOUNTER — Ambulatory Visit (INDEPENDENT_AMBULATORY_CARE_PROVIDER_SITE_OTHER): Payer: Medicaid Other

## 2017-05-21 ENCOUNTER — Ambulatory Visit (HOSPITAL_COMMUNITY)
Admission: EM | Admit: 2017-05-21 | Discharge: 2017-05-21 | Disposition: A | Discharge status: Home / Self Care | Payer: Medicaid Other | Attending: Family Medicine | Admitting: Family Medicine

## 2017-05-21 DIAGNOSIS — J181 Lobar pneumonia, unspecified organism: Secondary | ICD-10-CM

## 2017-05-21 DIAGNOSIS — J189 Pneumonia, unspecified organism: Secondary | ICD-10-CM

## 2017-05-21 MED ORDER — IBUPROFEN 800 MG PO TABS
ORAL_TABLET | ORAL | Status: AC
  Filled 2017-05-21: qty 1

## 2017-05-21 MED ORDER — HYDROCODONE-HOMATROPINE 5-1.5 MG/5ML PO SYRP: 5.0000 mL | ORAL_SOLUTION | Freq: Four times a day (QID) | ORAL | 0 refills | Status: DC | PRN

## 2017-05-21 MED ORDER — IBUPROFEN 800 MG PO TABS
800.0000 mg | ORAL_TABLET | Freq: Once | ORAL | Status: AC
  Administered 2017-05-21: 800 mg via ORAL

## 2017-05-21 MED ORDER — AZITHROMYCIN 250 MG PO TABS: 250.0000 mg | ORAL_TABLET | Freq: Every day | ORAL | 0 refills | Status: DC

## 2017-05-21 NOTE — Discharge Instructions (Signed)
Return in two days for re-evaluation, sooner if needed. If your symptoms worsen you should go to the Emergency Department.

## 2017-05-21 NOTE — ED Provider Notes (Signed)
[facility]   [id number] 05/21/17 Arrival Time: 1733  ASSESSMENT & PLAN:  1. Pneumonia of right middle lobe due to infectious organism [facility](HCC)     Meds ordered this encounter  Medications  . ibuprofen (ADVIL,MOTRIN) tablet 800 mg  . azithromycin (ZITHROMAX) 250 MG tablet    Sig: Take 1 tablet (250 mg total) by mouth daily. Take first 2 tablets together, then 1 every day until finished.    Dispense:  6 tablet    Refill:  0  . HYDROcodone-homatropine (HYCODAN) 5-1.5 MG/5ML syrup    Sig: Take 5 mLs by mouth every 6 (six) hours as needed for cough.    Dispense:  90 mL    Refill:  0   To return in 48 hours for recheck, sooner if needed. Feeling better after ibuprofen given. Medication sedation precautions given.  Reviewed expectations re: course of current medical issues. Questions answered. Outlined signs and symptoms indicating need for more acute intervention. Patient verbalized understanding. After Visit Summary given.   SUBJECTIVE:  Kyle Morris is a 26 y.o. male who presents with complaint of nasal congestion, post-nasal drainage, and a persistent cough. Body aches. Onset abrupt approximately 2-3 weeks ago. Overall fatigued. SOB: mild. Wheezing: none. Subjective fever. Tylenol with mild relief. Occasional post-tussive emesis. Decreased appetite. Tolerating PO intake. No h/o pneumonia or TB. Non-smoker.  ROS: As per HPI.   OBJECTIVE:  Vitals:   05/21/17 1804  BP: 112/66  Pulse: (!) 102  Resp: 20  Temp: (!) 102.5 F (39.2 C)  TempSrc: Oral  SpO2: 97%    Febrile. General appearance: alert; no distress but appears very fatigued HEENT: nasal congestion; clear runny nose; throat irritation secondary to post-nasal drainage Neck: supple without LAD Lungs: coarse breath sounds bilaterally; no wheezing; no respiratory distress Skin: warm and dry Psychological: alert and cooperative; normal mood and affect    Labs Reviewed - No data to display  Dg  Chest 2 View  Result Date: 05/21/2017 CLINICAL DATA:  Per pt: sick for three weeks, cough, fever now is 102.5, chest pain. Non-smoker. No history of respiratory or cardiac disease. No HBP or diabetes. EXAM: CHEST  2 VIEW COMPARISON:  04/29/2016 FINDINGS: Midline trachea. Normal heart size and mediastinal contours. No pleural effusion or pneumothorax. Right middle lobe and probable right lower lobe airspace disease. Clear left lung. IMPRESSION: Right middle lobe and probable right lower lobe airspace disease, most consistent with pneumonia. Electronically Signed   By: Jeronimo GreavesKyle  Talbot M.D.   On: 05/21/2017 19:01    No Known Allergies   Social History   Socioeconomic History  . Marital status: Married    Spouse name: Not on file  . Number of children: Not on file  . Years of education: Not on file  . Highest education level: Not on file  Social Needs  . Financial resource strain: Not on file  . Food insecurity - worry: Not on file  . Food insecurity - inability: Not on file  . Transportation needs - medical: Not on file  . Transportation needs - non-medical: Not on file  Occupational History  . Not on file  Tobacco Use  . Smoking status: Never Smoker  . Smokeless tobacco: Never Used  Substance and Sexual Activity  . Alcohol use: Yes  . Drug use: Not on file  . Sexual activity: Not on file  Other Topics Concern  . Not on file  Social History Narrative  . Not on file  Mardella LaymanHagler, Braelyn Jenson, MD 05/21/17 Ernestina [id number]

## 2017-05-21 NOTE — ED Triage Notes (Signed)
PT C/O: cold sx  ONSET: 3 weeks  SX INCLUDE: prod cough, HA, chest pain due to cough, ribcage pain, fevers  DENIES:   TAKING MEDS: none  A&O x4... NAD... Ambulatory

## 2017-05-23 ENCOUNTER — Encounter (HOSPITAL_COMMUNITY): Payer: Self-pay | Admitting: Emergency Medicine

## 2017-05-23 ENCOUNTER — Ambulatory Visit (HOSPITAL_COMMUNITY)
Admission: EM | Admit: 2017-05-23 | Discharge: 2017-05-23 | Disposition: A | Discharge status: Home / Self Care | Payer: Medicaid Other | Attending: Physician Assistant | Admitting: Physician Assistant

## 2017-05-23 DIAGNOSIS — J181 Lobar pneumonia, unspecified organism: Secondary | ICD-10-CM

## 2017-05-23 DIAGNOSIS — J189 Pneumonia, unspecified organism: Secondary | ICD-10-CM

## 2017-05-23 MED ORDER — NAPROXEN 500 MG PO TABS: 500.0000 mg | ORAL_TABLET | Freq: Two times a day (BID) | ORAL | 0 refills | Status: AC

## 2017-05-23 NOTE — ED Provider Notes (Addendum)
05/23/2017 8:11 PM   DOB: 09/24/1990 / MRN: [id number]  SUBJECTIVE:  Kyle Morris is a 26 y.o. male presenting for recheck of pneumonia. Tells me his is feeling a lot better but continues to have pain in his chest, mostly with activity.  Did have some rigor last time he was here but this is gone. He is also breathing better. Was advised to come back today per Dr. Tracie HarrierHagler for a recheck.   He has No Known Allergies.   He  has no past medical history on file.    He  reports that  has never smoked. he has never used smokeless tobacco. He reports that he drinks alcohol. He  has no sexual activity history on file. The patient  has no past surgical history on file.  His Family history is unknown by patient.  Review of Systems  Constitutional: Negative for chills and fever.  Cardiovascular: Negative for chest pain.  Gastrointestinal: Negative for nausea.  Skin: Negative for itching and rash.  Neurological: Negative for dizziness.    OBJECTIVE:  BP (!) 102/57 (BP Location: Left Arm)   Pulse 85   Temp 99.8 F (37.7 C) (Oral)   Resp 20   SpO2 100%   Wt Readings from Last 3 Encounters:  No data found for Wt   Temp Readings from Last 3 Encounters:  05/23/17 99.8 F (37.7 C) (Oral)  05/21/17 (!) 102.5 F (39.2 C) (Oral)  04/29/16 98 F (36.7 C)   BP Readings from Last 3 Encounters:  05/23/17 (!) 102/57  05/21/17 112/66  04/29/16 105/78   Pulse Readings from Last 3 Encounters:  05/23/17 85  05/21/17 (!) 102  04/29/16 62     Physical Exam  Constitutional: He appears well-developed and well-nourished. No distress.  Cardiovascular: Normal rate and regular rhythm.  Pulmonary/Chest: Effort normal and breath sounds normal. No respiratory distress. He has no wheezes. He has no rales. He exhibits no tenderness.  Skin: He is not diaphoretic.    No results found for this or any previous visit (from the past 72 hour(s)).  No results found.  ASSESSMENT AND PLAN:  The  encounter diagnosis was Pneumonia of right middle lobe due to infectious organism [facility](HCC). His vitals are improving.  He is non toxic in appearance.  He will continue hycodan and Z-pack.  Will add naprosyn and write a note for work, as he has been trying to work through his symptoms. He will come back on Monday if he feels that his improvement is stalling.    The patient is advised to call or return to clinic if he does not see an improvement in symptoms, or to seek the care of the closest emergency department if he worsens with the above plan.   Deliah BostonMichael Clark, MHS, PA-C 05/23/2017 8:11 PM        Ofilia Neaslark, Michael L, PA-C 05/23/17 2101

## 2017-05-23 NOTE — ED Triage Notes (Signed)
PT C/O: here for a f/u.... Seen here on 05/21/17 for pneumonia ... Reports getting better but still having chest pain/rib pain   ONSET: 3 weeks  SX ALSO INCLUDE: cough, mild HA  DENIES: fevers,   TAKING MEDS: Azithromycin, Naproxen   A&O x4... NAD... Ambulatory

## 2017-05-23 NOTE — Discharge Instructions (Signed)
Keep taking your cough syrup and antibiotic.  I am adding naprosyn to you regimen. If you are still feeling as poorly on Monday then I want you to come back here. Come back anytime if you are getting worse.

## 2017-05-30 ENCOUNTER — Ambulatory Visit (HOSPITAL_COMMUNITY)
Admission: EM | Admit: 2017-05-30 | Discharge: 2017-05-30 | Disposition: A | Discharge status: Home / Self Care | Payer: Medicaid Other | Attending: Family Medicine | Admitting: Family Medicine

## 2017-05-30 ENCOUNTER — Encounter (HOSPITAL_COMMUNITY): Payer: Self-pay | Admitting: Emergency Medicine

## 2017-05-30 ENCOUNTER — Other Ambulatory Visit: Payer: Self-pay

## 2017-05-30 DIAGNOSIS — K625 Hemorrhage of anus and rectum: Secondary | ICD-10-CM

## 2017-05-30 LAB — POCT I-STAT, CHEM 8
BUN: 5 mg/dL — AB (ref 6–20)
CHLORIDE: 103 mmol/L (ref 101–111)
CREATININE: 0.5 mg/dL — AB (ref 0.61–1.24)
Calcium, Ion: 1.17 mmol/L (ref 1.15–1.40)
Glucose, Bld: 74 mg/dL (ref 65–99)
HEMATOCRIT: 44 % (ref 39.0–52.0)
HEMOGLOBIN: 15 g/dL (ref 13.0–17.0)
POTASSIUM: 3.8 mmol/L (ref 3.5–5.1)
Sodium: 140 mmol/L (ref 135–145)
TCO2: 28 mmol/L (ref 22–32)

## 2017-05-30 MED ORDER — DOCUSATE SODIUM 100 MG PO CAPS
100.0000 mg | ORAL_CAPSULE | Freq: Every day | ORAL | 0 refills | Status: DC
Start: 2017-05-30 — End: 2020-06-16

## 2017-05-30 NOTE — ED Provider Notes (Signed)
[facility]    CSN: [id number] Arrival date & time: 05/30/17  1228     History   Chief Complaint Chief Complaint  Patient presents with  . Rectal Bleeding  . Back Pain    HPI Kyle Morris is a 26 y.o. male.   Kyle Morris presents with complaints of two episodes of blood in his stool. Yesterday and this morning he noted bright red blood. States he felt like he had to sit on the toilet longer than normal for him. This morning he felt dizzy. Has back pain as well, rates it 6/10. Denies abdominal pain, but states he has noticed intermittent abdominal pain. Denies constipation. Denies any previous similar episodes. He was recently treated for pneumonia, states he feels much improved. Last dose of naproxen yesterday. Decreased appetite but has been eating and drinking. Denies blood to urine. Without any other known medical history. Denies nausea or vomiting.    ROS per HPI.       History reviewed. No pertinent past medical history.  There are no active problems to display for this patient.   History reviewed. No pertinent surgical history.     Home Medications    Prior to Admission medications   Medication Sig Start Date End Date Taking? Authorizing Provider  docusate sodium (COLACE) 100 MG capsule Take 1 capsule (100 mg total) by mouth daily. 05/30/17   Georgetta HaberBurky, Natalie B, NP  HYDROcodone-homatropine (HYCODAN) 5-1.5 MG/5ML syrup Take 5 mLs by mouth every 6 (six) hours as needed for cough. 05/21/17   Mardella LaymanHagler, Brian, MD  naproxen (NAPROSYN) 500 MG tablet Take 1 tablet (500 mg total) by mouth 2 (two) times daily with a meal for 10 days. Avoid NSAID medications while taking this. 05/23/17 06/02/17  Ofilia Neaslark, Michael L, PA-C    Family History Family History  Family history unknown: Yes    Social History Social History   Tobacco Use  . Smoking status: Never Smoker  . Smokeless tobacco: Never Used  Substance Use Topics  . Alcohol use: Yes  . Drug use: Not  on file     Allergies   Patient has no known allergies.   Review of Systems Review of Systems   Physical Exam Triage Vital Signs ED Triage Vitals  Enc Vitals Group     BP 05/30/17 1304 116/81     Pulse Rate 05/30/17 1304 69     Resp 05/30/17 1304 18     Temp 05/30/17 1304 98.3 F (36.8 C)     Temp src --      SpO2 05/30/17 1304 100 %     Weight --      Height --      Head Circumference --      Peak Flow --      Pain Score 05/30/17 1305 6     Pain Loc --      Pain Edu? --      Excl. in GC? --    No data found.  Updated Vital Signs BP 116/81   Pulse 69   Temp 98.3 F (36.8 C)   Resp 18   SpO2 100%   Visual Acuity Right Eye Distance:   Left Eye Distance:   Bilateral Distance:    Right Eye Near:   Left Eye Near:    Bilateral Near:     Physical Exam  Constitutional: He is oriented to person, place, and time. He appears well-developed and well-nourished.  Cardiovascular: Normal rate and regular rhythm.  Pulmonary/Chest: Effort  normal and breath sounds normal.  Abdominal: Soft. Normal appearance. There is generalized tenderness. There is no rigidity, no rebound, no guarding, no CVA tenderness, no tenderness at McBurney's point and negative Murphy's sign.  Inconsistent abdominal pain with palpation, at times to LLQ, and at times to epigastric  Genitourinary: Rectum normal. Rectal exam shows no external hemorrhoid and anal tone normal.  Genitourinary Comments: Without any stool or blood on gross exam; without hemorrhoids or visible fissure   Neurological: He is alert and oriented to person, place, and time.  Skin: Skin is warm and dry.  Vitals reviewed.    UC Treatments / Results  Labs (all labs ordered are listed, but only abnormal results are displayed) Labs Reviewed  POCT I-STAT, CHEM 8 - Abnormal; Notable for the following components:      Result Value   BUN 5 (*)    Creatinine, Ser 0.50 (*)    All other components within normal limits    EKG   EKG Interpretation None       Radiology No results found.  Procedures Procedures (including critical care time)  Medications Ordered in UC Medications - No data to display   Initial Impression / Assessment and Plan / UC Course  I have reviewed the triage vital signs and the nursing notes.  Pertinent labs & imaging results that were available during my care of the patient were reviewed by me and considered in my medical decision making (see chart for details).     Benign physical findings today. Hemoglobin stable. Provided stool softener for possible fissure/constipation as source of rectal bleeding. Return precautions provided. If symptoms worsen or do not improve in the next week to return to be seen or to follow up with PCP. Patient verbalized understanding and agreeable to plan.    Final Clinical Impressions(s) / UC Diagnoses   Final diagnoses:  Rectal bleeding    ED Discharge Orders        Ordered    docusate sodium (COLACE) 100 MG capsule  Daily     05/30/17 1357       Controlled Substance Prescriptions [place] Controlled Substance Registry consulted? Not Applicable   Georgetta HaberBurky, Natalie B, NP 05/30/17 1400

## 2017-05-30 NOTE — ED Triage Notes (Signed)
Pt c/o head pain, back pain, pt also states when he has a BM "Im just pooping bright red blood".

## 2017-05-30 NOTE — Discharge Instructions (Signed)
Your blood levels are normal today. Please increase your fluid intake to maintain adequate hydration. Have sent a stool softener for you to take daily to try to prevent rectal bleeding. If you develop increased pain, bleeding, fevers, or persistent symptoms please return to be seen or visit er. Please establish with a primary care provider for continued management as needed.

## 2017-06-07 ENCOUNTER — Emergency Department (HOSPITAL_COMMUNITY)
Admission: EM | Admit: 2017-06-07 | Discharge: 2017-06-07 | Disposition: A | Discharge status: Home / Self Care | Payer: Medicaid Other | Attending: Emergency Medicine | Admitting: Emergency Medicine

## 2017-06-07 ENCOUNTER — Emergency Department (HOSPITAL_COMMUNITY): Payer: Medicaid Other

## 2017-06-07 DIAGNOSIS — J189 Pneumonia, unspecified organism: Secondary | ICD-10-CM

## 2017-06-07 DIAGNOSIS — J181 Lobar pneumonia, unspecified organism: Secondary | ICD-10-CM | POA: Diagnosis not present

## 2017-06-07 DIAGNOSIS — R05 Cough: Secondary | ICD-10-CM | POA: Diagnosis present

## 2017-06-07 LAB — BASIC METABOLIC PANEL
ANION GAP: 6 (ref 5–15)
BUN: 6 mg/dL (ref 6–20)
CHLORIDE: 104 mmol/L (ref 101–111)
CO2: 25 mmol/L (ref 22–32)
Calcium: 9 mg/dL (ref 8.9–10.3)
Creatinine, Ser: 0.5 mg/dL — ABNORMAL LOW (ref 0.61–1.24)
GFR calc Af Amer: >60 mL/min (ref >60)
GLUCOSE: 83 mg/dL (ref 65–99)
POTASSIUM: 4.1 mmol/L (ref 3.5–5.1)
Sodium: 135 mmol/L (ref 135–145)

## 2017-06-07 LAB — CBC
HEMATOCRIT: 42.9 % (ref 39.0–52.0)
HEMOGLOBIN: 14.7 g/dL (ref 13.0–17.0)
MCH: 29.5 pg (ref 26.0–34.0)
MCHC: 34.3 g/dL (ref 30.0–36.0)
MCV: 86 fL (ref 78.0–100.0)
Platelets: 178 10*3/uL (ref 150–400)
RBC: 4.99 MIL/uL (ref 4.22–5.81)
RDW: 13.5 % (ref 11.5–15.5)
WBC: 3.2 10*3/uL — ABNORMAL LOW (ref 4.0–10.5)

## 2017-06-07 LAB — I-STAT TROPONIN, ED: Troponin i, poc: 0 ng/mL (ref 0.00–0.08)

## 2017-06-07 MED ORDER — SODIUM CHLORIDE 0.9 % IV BOLUS (SEPSIS)
1000.0000 mL | Freq: Once | INTRAVENOUS | Status: AC
  Administered 2017-06-07: 1000 mL via INTRAVENOUS

## 2017-06-07 MED ORDER — KETOROLAC TROMETHAMINE 30 MG/ML IJ SOLN
30.0000 mg | Freq: Once | INTRAMUSCULAR | Status: AC
  Administered 2017-06-07: 30 mg via INTRAVENOUS
  Filled 2017-06-07: qty 1

## 2017-06-07 MED ORDER — ACETAMINOPHEN-CODEINE 120-12 MG/5ML PO SOLN: 10.0000 mL | ORAL | 0 refills | Status: DC | PRN

## 2017-06-07 NOTE — ED Triage Notes (Addendum)
Pt was diagnosed with pneumonia at urgent care. States the medication was not helping states left sided chest pain also for 1 week. Pt also states he becomes diaphoretic and wakes up covered in sweat. States "chest pains not related to coughing. " Pt has also been feeling dizzy and weak.

## 2017-06-07 NOTE — ED Provider Notes (Signed)
[facility] Provider Note   CSN: [id number] Arrival date & time: 06/07/17  1140     History   Chief Complaint Chief Complaint  Patient presents with  . Cough  . URI  . Chest Pain    HPI Kyle Morris is a 26 y.o. male.  HPI Patient presents to the emergency department with cough with chest discomfort.  The patient had a diagnosis of pneumonia recently.  He was placed on antibiotics along with cough suppressant.  Patient states that he continues to have pain patient states that nothing seems to make the condition better.  Certain positions and movements of his chest make the pain worse.  He states the cough also makes the pain worse.  Patient states that he does not have any fevers.  The patient denies shortness of breath, headache,blurred vision, neck pain, fever, cough, weakness, numbness, dizziness, anorexia, edema, abdominal pain, nausea, vomiting, diarrhea, rash, back pain, dysuria, hematemesis, bloody stool, near syncope, or syncope. No past medical history on file.  There are no active problems to display for this patient.   No past surgical history on file.     Home Medications    Prior to Admission medications   Medication Sig Start Date End Date Taking? Authorizing Provider  HYDROcodone-homatropine (HYCODAN) 5-1.5 MG/5ML syrup Take 5 mLs by mouth every 6 (six) hours as needed for cough. 05/21/17  Yes Hagler, Arlys JohnBrian, MD  naproxen (NAPROSYN) 500 MG tablet Take 500 mg by mouth 2 (two) times daily with a meal.   Yes [provider]  docusate sodium (COLACE) 100 MG capsule Take 1 capsule (100 mg total) by mouth daily. Patient not taking: Reported on 06/07/2017 05/30/17   Georgetta HaberBurky, Natalie B, NP    Family History Family History  Family history unknown: Yes    Social History Social History   Tobacco Use  . Smoking status: Never Smoker  . Smokeless tobacco: Never Used  Substance Use Topics  . Alcohol use: Yes    . Drug use: Not on file     Allergies   Patient has no known allergies.   Review of Systems Review of Systems  All other systems negative except as documented in the HPI. All pertinent positives and negatives as reviewed in the HPI. Physical Exam Updated Vital Signs BP 115/79   Pulse (!) 59   Temp 97.9 F (36.6 C) (Oral)   Resp 16   SpO2 100%   Physical Exam  Constitutional: He is oriented to person, place, and time. He appears well-developed and well-nourished. No distress.  HENT:  Head: Normocephalic and atraumatic.  Mouth/Throat: Oropharynx is clear and moist.  Eyes: Pupils are equal, round, and reactive to light.  Neck: Normal range of motion. Neck supple.  Cardiovascular: Normal rate, regular rhythm and normal heart sounds. Exam reveals no gallop and no friction rub.  No murmur heard. Pulmonary/Chest: Effort normal and breath sounds normal. No respiratory distress. He has no wheezes.  Abdominal: Soft. Bowel sounds are normal. He exhibits no distension. There is no tenderness.  Neurological: He is alert and oriented to person, place, and time. He exhibits normal muscle tone. Coordination normal.  Skin: Skin is warm and dry. Capillary refill takes less than 2 seconds. No rash noted. No erythema.  Psychiatric: He has a normal mood and affect. His behavior is normal.  Nursing note and vitals reviewed.    ED Treatments / Results  Labs (all labs ordered are listed, but only abnormal results  are displayed) Labs Reviewed  BASIC METABOLIC PANEL - Abnormal; Notable for the following components:      Result Value   Creatinine, Ser 0.50 (*)    All other components within normal limits  CBC - Abnormal; Notable for the following components:   WBC 3.2 (*)    All other components within normal limits  I-STAT TROPONIN, ED    EKG  EKG Interpretation  Date/Time:  Saturday June 07 2017 11:59:29 EST Ventricular Rate:  72 PR Interval:  160 QRS Duration: 88 QT  Interval:  340 QTC Calculation: 372 R Axis:   66 Text Interpretation:  Normal sinus rhythm ST elevation, consider early repolarization Borderline ECG Confirmed by Rolland PorterJames, Mark ([id number]) on 06/07/2017 12:05:09 PM Also confirmed by Rolland PorterJames, Mark ([id number]), editor Madalyn RobEverhart, Marilyn [phone]([id number])  on 06/07/2017 12:21:54 PM       Radiology Dg Chest 2 View  Result Date: 06/07/2017 CLINICAL DATA:  Right middle lobe pneumonia diagnosed 2-3 weeks ago. Follow-up. EXAM: CHEST  2 VIEW COMPARISON:  05/21/2017 FINDINGS: There is radiographic improvement. Right lower lobe infiltrate is no longer visible. There is improving right middle lobe infiltrate and volume loss, though this is not normalized completely. Left lung remains clear. No effusions. IMPRESSION: Resolution of previously seen right lower lobe infiltrate. Improving right middle lobe infiltrate and volume loss, though not completely normalized. Electronically Signed   By: Paulina FusiMark  Shogry M.D.   On: 06/07/2017 13:17    Procedures Procedures (including critical care time)  Medications Ordered in ED Medications  sodium chloride 0.9 % bolus 1,000 mL (0 mLs Intravenous Stopped 06/07/17 1514)  ketorolac (TORADOL) 30 MG/ML injection 30 mg (30 mg Intravenous Given 06/07/17 1435)     Initial Impression / Assessment and Plan / ED Course  I have reviewed the triage vital signs and the nursing notes.  Pertinent labs & imaging results that were available during my care of the patient were reviewed by me and considered in my medical decision making (see chart for details).     Patient will need further control of his discomfort.  I did not prescribe further antibiotics as the pneumonia is clearing and has resolved dramatically since the previous chest x-ray.  Final Clinical Impressions(s) / ED Diagnoses   Final diagnoses:  None    ED Discharge Orders    None       Kyra MangesLawyer, Daiki Dicostanzo, PA-C 06/08/17 0720    Rolland PorterJames, Mark, MD 06/18/17 1034

## 2017-06-07 NOTE — Discharge Instructions (Signed)
Return here as needed. Follow up with your doctor. The pneumonia is resolving and has  greatly improved. Increase your fluid intake.

## 2017-07-07 ENCOUNTER — Encounter (HOSPITAL_COMMUNITY): Payer: Self-pay | Admitting: Family Medicine

## 2017-07-07 ENCOUNTER — Other Ambulatory Visit: Payer: Self-pay

## 2017-07-07 ENCOUNTER — Ambulatory Visit (INDEPENDENT_AMBULATORY_CARE_PROVIDER_SITE_OTHER): Payer: Medicaid Other

## 2017-07-07 ENCOUNTER — Ambulatory Visit (HOSPITAL_COMMUNITY)
Admission: EM | Admit: 2017-07-07 | Discharge: 2017-07-07 | Disposition: A | Discharge status: Home / Self Care | Payer: Medicaid Other | Attending: Family Medicine | Admitting: Family Medicine

## 2017-07-07 DIAGNOSIS — J181 Lobar pneumonia, unspecified organism: Secondary | ICD-10-CM

## 2017-07-07 DIAGNOSIS — R05 Cough: Secondary | ICD-10-CM

## 2017-07-07 DIAGNOSIS — R0789 Other chest pain: Secondary | ICD-10-CM

## 2017-07-07 DIAGNOSIS — J189 Pneumonia, unspecified organism: Secondary | ICD-10-CM

## 2017-07-07 DIAGNOSIS — R059 Cough, unspecified: Secondary | ICD-10-CM

## 2017-07-07 MED ORDER — DICLOFENAC SODIUM 75 MG PO TBEC: 75.0000 mg | DELAYED_RELEASE_TABLET | Freq: Two times a day (BID) | ORAL | 0 refills | Status: DC

## 2017-07-07 MED ORDER — RANITIDINE HCL 150 MG PO CAPS: 150.0000 mg | ORAL_CAPSULE | Freq: Every day | ORAL | 1 refills | Status: DC

## 2017-07-07 NOTE — ED Provider Notes (Signed)
[facility]   [id number] 07/07/17 Arrival Time: 1238  ASSESSMENT & PLAN:  1. Pneumonia of right middle lobe due to infectious organism (HCC)   2. Chest wall pain    CXR normal today.  Meds ordered this encounter  Medications  . ranitidine (ZANTAC) 150 MG capsule    Sig: Take 1 capsule (150 mg total) by mouth daily.    Dispense:  30 capsule    Refill:  1  . diclofenac (VOLTAREN) 75 MG EC tablet    Sig: Take 1 tablet (75 mg total) by mouth 2 (two) times daily.    Dispense:  14 tablet    Refill:  0   Question if current coughing related to acid reflux. Trial of Zantac. Diclofenac for costochondritis-like symptoms. Will f/u here if not seeing improvement in symptoms within the next 1-2 weeks.  Reviewed expectations re: course of current medical issues. Questions answered. Outlined signs and symptoms indicating need for more acute intervention. Patient verbalized understanding. After Visit Summary given.   SUBJECTIVE: History from: patient.  Kyle Morris is a 27 y.o. male who presents with complaint of persistent anterior chest wall discomfort since being diagnosed with pneumonia on 05/21/2017 here. No specific aggravating or alleviating factors reported. Reports persistent dry cough and questions relation. No SOB or wheezing reported. Finished antibiotics. Afebrile. No n/v. Normal appetite and PO intake. Able to sleep through the night fairly well. Does continue to feel fatigued. No OTC treatment for chest wall discomfort or cough. No new symptoms reported. No PMH of TB.  Does describe some epigastric discomfort lasting approx 30 minutes after eating. Occasionally but a little more frequent recently. Belching more. Questions relation to current symptoms.  Social History   Tobacco Use  Smoking Status Never Smoker  Smokeless Tobacco Never Used   ROS: As per HPI.   OBJECTIVE:  Vitals:   07/07/17 1308  BP: 122/82  Pulse: 71  Resp: 16  Temp: 98.9 F  (37.2 C)  TempSrc: Oral  SpO2: 99%     General appearance: alert; no distress HEENT: nares and mouth normal Neck: supple without LAD Abd: soft and non-tender Chest Wall: some poorly localized tenderness to lower anterior chest wall; no gross abnormalities Lungs: unlabored respirations, symmetrical air entry; cough: mild and non-productive; no respiratory distress Skin: warm and dry Psychological: alert and cooperative; normal mood and affect  Imaging: Dg Chest 2 View  Result Date: 07/07/2017 CLINICAL DATA:  27 year old male with a history of chest wall pain EXAM: CHEST  2 VIEW COMPARISON:  06/07/2017 FINDINGS: Cardiomediastinal silhouette unchanged in size and contour. No evidence of central vascular congestion. No pneumothorax or pleural effusion. No confluent airspace disease. No acute displaced fracture. IMPRESSION: No radiographic evidence of acute cardiopulmonary disease. Electronically Signed   By: Gilmer MorJaime  Wagner D.O.   On: 07/07/2017 13:44    No Known Allergies   Family History  Family history unknown: Yes   Social History   Socioeconomic History  . Marital status: Married    Spouse name: Not on file  . Number of children: Not on file  . Years of education: Not on file  . Highest education level: Not on file  Social Needs  . Financial resource strain: Not on file  . Food insecurity - worry: Not on file  . Food insecurity - inability: Not on file  . Transportation needs - medical: Not on file  . Transportation needs - non-medical: Not on file  Occupational History  . Not on file  Tobacco Use  . Smoking status: Never Smoker  . Smokeless tobacco: Never Used  Substance and Sexual Activity  . Alcohol use: Yes  . Drug use: Not on file  . Sexual activity: Not on file  Other Topics Concern  . Not on file  Social History Narrative  . Not on file           Mardella Layman, MD 07/07/17 [id number]

## 2017-07-07 NOTE — ED Triage Notes (Signed)
Pt here for continued lower rib cage/lung pain and cough s/p pneumonia.

## 2017-11-06 ENCOUNTER — Encounter (HOSPITAL_COMMUNITY): Payer: Self-pay | Admitting: Emergency Medicine

## 2017-11-06 ENCOUNTER — Ambulatory Visit (HOSPITAL_COMMUNITY)
Admission: EM | Admit: 2017-11-06 | Discharge: 2017-11-06 | Disposition: A | Discharge status: Home / Self Care | Payer: Medicaid Other | Attending: Family Medicine | Admitting: Family Medicine

## 2017-11-06 DIAGNOSIS — M5441 Lumbago with sciatica, right side: Secondary | ICD-10-CM | POA: Diagnosis not present

## 2017-11-06 DIAGNOSIS — H1013 Acute atopic conjunctivitis, bilateral: Secondary | ICD-10-CM | POA: Diagnosis not present

## 2017-11-06 MED ORDER — KETOROLAC TROMETHAMINE 60 MG/2ML IM SOLN
60.0000 mg | Freq: Once | INTRAMUSCULAR | Status: AC
  Administered 2017-11-06: 60 mg via INTRAMUSCULAR

## 2017-11-06 MED ORDER — KETOROLAC TROMETHAMINE 60 MG/2ML IM SOLN
INTRAMUSCULAR | Status: AC
  Filled 2017-11-06: qty 2

## 2017-11-06 MED ORDER — NAPROXEN 500 MG PO TABS: 500.0000 mg | ORAL_TABLET | Freq: Two times a day (BID) | ORAL | 0 refills | Status: DC

## 2017-11-06 MED ORDER — KETOTIFEN FUMARATE 0.025 % OP SOLN: 1.0000 [drp] | Freq: Two times a day (BID) | OPHTHALMIC | 0 refills | Status: DC

## 2017-11-06 NOTE — ED Triage Notes (Signed)
PT reports lower right sided back pain and pain in both sides of chest for over 2 weeks. Pain is worse with movement.   PT also reports he has been rubbing right eye and it is painful

## 2017-11-06 NOTE — ED Provider Notes (Signed)
[facility]    CSN: [id number] Arrival date & time: 11/06/17  1316     History   Chief Complaint Chief Complaint  Patient presents with  . Back Pain  . Chest Pain    HPI Kyle Morris is a 27 y.o. male.   Dontai present with complaints of right low back pain which radiates down right leg which has been ongoing for 2 weeks. He states he works at [employer] and had carried heavy items which he feels may have precipitated the pain. Ambulatory but with pain. Without fevers, pain or blood to urine. States he has had some urinary urgency recently. Without stool incontinence. Without weakness to right leg. Denies any previous similar. Worse when he first wakes up. Also complaints of bilateral eyes itching. This has been ongoing for some time. No drainage, pain or vision change. Has not tried any medications for this.    ROS per HPI.      History reviewed. No pertinent past medical history.  There are no active problems to display for this patient.   History reviewed. No pertinent surgical history.     Home Medications    Prior to Admission medications   Medication Sig Start Date End Date Taking? Authorizing Provider  acetaminophen-codeine 120-12 MG/5ML solution Take 10 mLs by mouth every 4 (four) hours as needed for moderate pain. 06/07/17   Lawyer, Cristal Deer, PA-C  diclofenac (VOLTAREN) 75 MG EC tablet Take 1 tablet (75 mg total) by mouth 2 (two) times daily. 07/07/17   Mardella Layman, MD  docusate sodium (COLACE) 100 MG capsule Take 1 capsule (100 mg total) by mouth daily. 05/30/17   Georgetta Haber, NP  ketotifen (ZADITOR) 0.025 % ophthalmic solution Place 1 drop into both eyes 2 (two) times daily. 11/06/17   Georgetta Haber, NP  naproxen (NAPROSYN) 500 MG tablet Take 1 tablet (500 mg total) by mouth 2 (two) times daily with a meal. 11/06/17   Burky, Barron Alvine, NP  ranitidine (ZANTAC) 150 MG capsule Take 1 capsule (150 mg total) by mouth daily. 07/07/17    Mardella Layman, MD    Family History Family History  Family history unknown: Yes    Social History Social History   Tobacco Use  . Smoking status: Never Smoker  . Smokeless tobacco: Never Used  Substance Use Topics  . Alcohol use: Yes  . Drug use: Not on file     Allergies   Patient has no known allergies.   Review of Systems Review of Systems   Physical Exam Triage Vital Signs ED Triage Vitals  Enc Vitals Group     BP 11/06/17 1402 123/75     Pulse Rate 11/06/17 1402 67     Resp 11/06/17 1402 16     Temp 11/06/17 1402 98.3 F (36.8 C)     Temp Source 11/06/17 1402 Temporal     SpO2 11/06/17 1402 100 %     Weight 11/06/17 1401 155 lb (70.3 kg)     Height --      Head Circumference --      Peak Flow --      Pain Score 11/06/17 1401 8     Pain Loc --      Pain Edu? --      Excl. in GC? --    No data found.  Updated Vital Signs BP 123/75   Pulse 67   Temp 98.3 F (36.8 C) (Temporal)   Resp 16   Wt  155 lb (70.3 kg)   SpO2 100%    Physical Exam  Constitutional: He is oriented to person, place, and time. He appears well-developed and well-nourished.  HENT:  Head: Normocephalic and atraumatic.  Right Ear: External ear normal.  Left Ear: External ear normal.  Eyes: Pupils are equal, round, and reactive to light. Conjunctivae and EOM are normal. Right eye exhibits no discharge. Left eye exhibits no discharge.  Neck: Normal range of motion.  Cardiovascular: Normal rate and regular rhythm.  Pulmonary/Chest: Effort normal and breath sounds normal.  Musculoskeletal:       Lumbar back: He exhibits tenderness and pain. He exhibits no bony tenderness, no swelling, no edema, no deformity, no laceration, no spasm and normal pulse.       Back:  Right low back musculature with tenderness; without spinous process tenderness or step off; pain with heel weight bearing; strength equal bilaterally; gross sensation intact; pain with right hip flexion and mild pain with  straight leg raise to right side  Neurological: He is alert and oriented to person, place, and time.  Skin: Skin is warm and dry.     UC Treatments / Results  Labs (all labs ordered are listed, but only abnormal results are displayed) Labs Reviewed - No data to display  EKG None  Radiology No results found.  Procedures Procedures (including critical care time)  Medications Ordered in UC Medications  ketorolac (TORADOL) injection 60 mg (has no administration in time range)    Initial Impression / Assessment and Plan / UC Course  I have reviewed the triage vital signs and the nursing notes.  Pertinent labs & imaging results that were available during my care of the patient were reviewed by me and considered in my medical decision making (see chart for details).     Without red flag findings on exam although does appear to have quite significant right low back pain. toradol provided in clinic today, naproxen BID recommended. Exercises provided. To establish with a PCP, follow up with neurosurgery as needed for severe or persistent pain. Twice a day eye drops for itching eyes. Return precautions provided. Patient verbalized understanding and agreeable to plan. Ambulatory out of clinic without slight limp noted.    Final Clinical Impressions(s) / UC Diagnoses   Final diagnoses:  Acute right-sided low back pain with right-sided sciatica  Allergic conjunctivitis of both eyes     Discharge Instructions     Light and regular activity.  Use of naproxen twice a day, take with food, take first dose tonight. Twice a day eye drops.  If you lose control of your bladder, cannot walk, develop numbness or tingling of your feet or legs please go to the Er.  May follow up with neurosurgery or a primary care provider for persistent symptoms.    ED Prescriptions    Medication Sig Dispense Auth. Provider   naproxen (NAPROSYN) 500 MG tablet Take 1 tablet (500 mg total) by mouth 2 (two)  times daily with a meal. 30 tablet Linus Mako B, NP   ketotifen (ZADITOR) 0.025 % ophthalmic solution Place 1 drop into both eyes 2 (two) times daily. 5 mL Linus Mako B, NP     Controlled Substance Prescriptions [place] Controlled Substance Registry consulted? Not Applicable   Georgetta Haber, NP 11/06/17 1511

## 2017-11-06 NOTE — Discharge Instructions (Signed)
Light and regular activity.  Use of naproxen twice a day, take with food, take first dose tonight. Twice a day eye drops.  If you lose control of your bladder, cannot walk, develop numbness or tingling of your feet or legs please go to the Er.  May follow up with neurosurgery or a primary care provider for persistent symptoms.

## 2018-07-13 ENCOUNTER — Ambulatory Visit (HOSPITAL_COMMUNITY)
Admission: EM | Admit: 2018-07-13 | Discharge: 2018-07-13 | Disposition: A | Discharge status: Home / Self Care | Payer: Medicaid Other | Attending: Family Medicine | Admitting: Family Medicine

## 2018-07-13 ENCOUNTER — Encounter (HOSPITAL_COMMUNITY): Payer: Self-pay | Admitting: Emergency Medicine

## 2018-07-13 DIAGNOSIS — M545 Low back pain, unspecified: Secondary | ICD-10-CM

## 2018-07-13 DIAGNOSIS — J029 Acute pharyngitis, unspecified: Secondary | ICD-10-CM

## 2018-07-13 DIAGNOSIS — R300 Dysuria: Secondary | ICD-10-CM

## 2018-07-13 LAB — POCT URINALYSIS DIP (DEVICE)
Bilirubin Urine: NEGATIVE
Glucose, UA: NEGATIVE mg/dL
Hgb urine dipstick: NEGATIVE
Ketones, ur: NEGATIVE mg/dL
Leukocytes, UA: NEGATIVE
Nitrite: NEGATIVE
PH: 7.5 (ref 5.0–8.0)
Protein, ur: NEGATIVE mg/dL
Specific Gravity, Urine: 1.01 (ref 1.005–1.030)
UROBILINOGEN UA: 0.2 mg/dL (ref 0.0–1.0)

## 2018-07-13 LAB — POCT RAPID STREP A: Streptococcus, Group A Screen (Direct): NEGATIVE

## 2018-07-13 MED ORDER — NAPROXEN 500 MG PO TABS: 500.0000 mg | ORAL_TABLET | Freq: Two times a day (BID) | ORAL | 0 refills | Status: DC

## 2018-07-13 NOTE — ED Triage Notes (Signed)
Pt sts body aches in head, back and chest

## 2018-07-13 NOTE — Discharge Instructions (Signed)
We will call you with the results from today  Please try the exercises and medicine for back pain. Please try heat and massage  Please follow up if your symptoms are not improving.

## 2018-07-13 NOTE — ED Provider Notes (Signed)
[facility]    CSN: [id number] Arrival date & time: 07/13/18  1747     History   Chief Complaint Chief Complaint  Patient presents with  . Generalized Body Aches    HPI Kyle Morris is a 28 y.o. male.   He is presenting with sore throat, dysuria, and back pain.  His sore throat is been ongoing for a couple of days.  He had kissed someone new and his symptoms develop.  Denies any fevers or chills.  He has noticed white spots in the back of his throat.  Having dysuria for the past few days.  Denies any fevers or chills.  Denies any costovertebral tenderness.  Denies any discharge or foul smell.  Denies any rashes.  Denies any new sexual partners.  Has ongoing acute on chronic low back pain.  Has not tried anything for this.  Has been present for several months.  Denies any inciting event or trauma.  HPI  History reviewed. No pertinent past medical history.  There are no active problems to display for this patient.   History reviewed. No pertinent surgical history.     Home Medications    Prior to Admission medications   Medication Sig Start Date End Date Taking? Authorizing Provider  acetaminophen-codeine 120-12 MG/5ML solution Take 10 mLs by mouth every 4 (four) hours as needed for moderate pain. 06/07/17   Lawyer, Cristal Deer, PA-C  diclofenac (VOLTAREN) 75 MG EC tablet Take 1 tablet (75 mg total) by mouth 2 (two) times daily. 07/07/17   Kyle Layman, MD  docusate sodium (COLACE) 100 MG capsule Take 1 capsule (100 mg total) by mouth daily. 05/30/17   Kyle Haber, NP  ketotifen (ZADITOR) 0.025 % ophthalmic solution Place 1 drop into both eyes 2 (two) times daily. 11/06/17   Kyle Haber, NP  naproxen (NAPROSYN) 500 MG tablet Take 1 tablet (500 mg total) by mouth 2 (two) times daily with a meal. 07/13/18 07/13/19  Kyle Rude, MD  ranitidine (ZANTAC) 150 MG capsule Take 1 capsule (150 mg total) by mouth daily. 07/07/17   Kyle Layman, MD    Family  History Family History  Family history unknown: Yes    Social History Social History   Tobacco Use  . Smoking status: Never Smoker  . Smokeless tobacco: Never Used  Substance Use Topics  . Alcohol use: Yes  . Drug use: Not on file     Allergies   Patient has no known allergies.   Review of Systems Review of Systems  Constitutional: Negative for fever.  HENT: Positive for sore throat.   Respiratory: Negative for cough.   Cardiovascular: Negative for chest pain.  Gastrointestinal: Negative for abdominal pain.  Genitourinary: Positive for dysuria.  Musculoskeletal: Positive for back pain.  Skin: Negative for color change.  Neurological: Negative for weakness.  Hematological: Negative for adenopathy.  Psychiatric/Behavioral: Negative for agitation.     Physical Exam Triage Vital Signs ED Triage Vitals [07/13/18 1850]  Enc Vitals Group     BP 128/83     Pulse Rate 68     Resp 18     Temp 98.4 F (36.9 C)     Temp Source Temporal     SpO2 100 %     Weight      Height      Head Circumference      Peak Flow      Pain Score 6     Pain Loc  Pain Edu?      Excl. in GC?    No data found.  Updated Vital Signs BP 128/83 (BP Location: Right Arm)   Pulse 68   Temp 98.4 F (36.9 C) (Temporal)   Resp 18   SpO2 100%   Visual Acuity Right Eye Distance:   Left Eye Distance:   Bilateral Distance:    Right Eye Near:   Left Eye Near:    Bilateral Near:     Physical Exam Gen: NAD, alert, cooperative with exam, well-appearing ENT: normal lips, normal nasal mucosa, tympanic membranes clear and intact bilaterally, normal oropharynx, no cervical lymphadenopathy Eye: normal EOM, normal conjunctiva and lids CV:  no edema, +2 pedal pulses   Resp: no accessory muscle use, non-labored,  Skin: no rashes, no areas of induration  Neuro: normal tone, normal sensation to touch Psych:  normal insight, alert and oriented MSK: No CVA tenderness bilaterally Back: No  tenderness to palpation over the midline lumbar spine or bilateral paraspinal muscles. No tenderness palpation of the SI joints or greater trochanter. Normal internal and external rotation of the hips. Normal strength resistance with hip flexion, knee flexion extension, plantarflexion or dorsiflexion. Negative straight leg raise bilaterally. Neurovascular intact   UC Treatments / Results  Labs (all labs ordered are listed, but only abnormal results are displayed) Labs Reviewed  CULTURE, GROUP A STREP [facility]  POCT URINALYSIS DIP (DEVICE)  POCT RAPID STREP A  URINE CYTOLOGY ANCILLARY ONLY    EKG None  Radiology No results found.  Procedures Procedures (including critical care time)  Medications Ordered in UC Medications - No data to display  Initial Impression / Assessment and Plan / UC Course  I have reviewed the triage vital signs and the nursing notes.  Pertinent labs & imaging results that were available during my care of the patient were reviewed by me and considered in my medical decision making (see chart for details).     [name] is a 28 year old male that is presenting with sore throat, low back pain, dysuria.  Rapid strep test was negative and was sent for culture.  Low back pain appears to be muscular spasm related.  Provided naproxen for pain.  Has good strength and no signs of nerve impingement.  Counseled on home exercise therapy and supportive care.  Also presenting with dysuria.  Denies any new sexual encounters.  Will provide urinalysis and evaluate for cytology.  Given indications to follow-up.  Final Clinical Impressions(s) / UC Diagnoses   Final diagnoses:  Acute bilateral low back pain without sciatica  Dysuria  Sore throat     Discharge Instructions     We will call you with the results from today  Please try the exercises and medicine for back pain. Please try heat and massage  Please follow up if your symptoms are not improving.     ED  Prescriptions    Medication Sig Dispense Auth. Provider   naproxen (NAPROSYN) 500 MG tablet Take 1 tablet (500 mg total) by mouth 2 (two) times daily with a meal. 60 tablet Kyle RudeSchmitz, Kuulei Kleier E, MD     Controlled Substance Prescriptions [place] Controlled Substance Registry consulted? Not Applicable   Kyle RudeSchmitz, Lorrine Killilea E, MD 07/13/18 2138

## 2018-07-14 LAB — URINE CYTOLOGY ANCILLARY ONLY
Chlamydia: NEGATIVE
Neisseria Gonorrhea: NEGATIVE
Trichomonas: NEGATIVE

## 2018-07-16 LAB — CULTURE, GROUP A STREP (THRC)

## 2018-09-05 ENCOUNTER — Other Ambulatory Visit: Payer: Self-pay

## 2018-09-05 ENCOUNTER — Emergency Department (HOSPITAL_COMMUNITY)
Admission: EM | Admit: 2018-09-05 | Discharge: 2018-09-05 | Disposition: A | Discharge status: Home / Self Care | Payer: Medicaid Other | Attending: Emergency Medicine | Admitting: Emergency Medicine

## 2018-09-05 ENCOUNTER — Encounter (HOSPITAL_COMMUNITY): Payer: Self-pay

## 2018-09-05 DIAGNOSIS — Z79899 Other long term (current) drug therapy: Secondary | ICD-10-CM | POA: Insufficient documentation

## 2018-09-05 DIAGNOSIS — R109 Unspecified abdominal pain: Secondary | ICD-10-CM | POA: Insufficient documentation

## 2018-09-05 LAB — CBC
HEMATOCRIT: 51.5 % (ref 39.0–52.0)
Hemoglobin: 17.1 g/dL — ABNORMAL HIGH (ref 13.0–17.0)
MCH: 28.9 pg (ref 26.0–34.0)
MCHC: 33.2 g/dL (ref 30.0–36.0)
MCV: 87.1 fL (ref 80.0–100.0)
Platelets: 169 10*3/uL (ref 150–400)
RBC: 5.91 MIL/uL — ABNORMAL HIGH (ref 4.22–5.81)
RDW: 12.8 % (ref 11.5–15.5)
WBC: 3.8 10*3/uL — ABNORMAL LOW (ref 4.0–10.5)
nRBC: 0 % (ref 0.0–0.2)

## 2018-09-05 LAB — BASIC METABOLIC PANEL
Anion gap: 8 (ref 5–15)
CO2: 22 mmol/L (ref 22–32)
Calcium: 9.7 mg/dL (ref 8.9–10.3)
Chloride: 108 mmol/L (ref 98–111)
Creatinine, Ser: 0.54 mg/dL — ABNORMAL LOW (ref 0.61–1.24)
GFR calc Af Amer: >60 mL/min (ref >60)
GFR calc non Af Amer: >60 mL/min (ref >60)
Glucose, Bld: 102 mg/dL — ABNORMAL HIGH (ref 70–99)
Potassium: 4 mmol/L (ref 3.5–5.1)
Sodium: 138 mmol/L (ref 135–145)

## 2018-09-05 LAB — URINALYSIS, ROUTINE W REFLEX MICROSCOPIC
BILIRUBIN URINE: NEGATIVE
Glucose, UA: NEGATIVE mg/dL
Hgb urine dipstick: NEGATIVE
Ketones, ur: NEGATIVE mg/dL
Leukocytes,Ua: NEGATIVE
Nitrite: NEGATIVE
Protein, ur: NEGATIVE mg/dL
SPECIFIC GRAVITY, URINE: 1.017 (ref 1.005–1.030)
pH: 7 (ref 5.0–8.0)

## 2018-09-05 MED ORDER — IBUPROFEN 400 MG PO TABS
600.0000 mg | ORAL_TABLET | Freq: Once | ORAL | Status: AC
  Administered 2018-09-05: 600 mg via ORAL
  Filled 2018-09-05: qty 1

## 2018-09-05 NOTE — Discharge Instructions (Signed)
Use tylenol, motrin and ice as needed for pain. Return for uncontrolled pain, fever, vomiting, blood in urine or new concerns.

## 2018-09-05 NOTE — ED Triage Notes (Signed)
Pt states right flank pain that started yesterday. Pt also reports fatigue. Pt denies any n/v or urinary sx.

## 2018-09-05 NOTE — ED Provider Notes (Signed)
[facility] EMERGENCY DEPARTMENT Provider Note   CSN: [id number] Arrival date & time: 09/05/18  1732    History   Chief Complaint Chief Complaint  Patient presents with  . Flank Pain    HPI Kyle Morris is a 28 y.o. male.     Patient presenting with right flank pain worse with twisting since yesterday.  Patient has been lifting heavier things recently no direct trauma.  No urinary symptoms.  No fevers or chills.  No kidney stone history.  Pain currently mild and constant.  Patient does not have a primary care doctor.     History reviewed. No pertinent past medical history.  There are no active problems to display for this patient.   History reviewed. No pertinent surgical history.      Home Medications    Prior to Admission medications   Medication Sig Start Date End Date Taking? Authorizing Provider  acetaminophen-codeine 120-12 MG/5ML solution Take 10 mLs by mouth every 4 (four) hours as needed for moderate pain. 06/07/17   Lawyer, Cristal Deer, PA-C  diclofenac (VOLTAREN) 75 MG EC tablet Take 1 tablet (75 mg total) by mouth 2 (two) times daily. 07/07/17   Mardella Layman, MD  docusate sodium (COLACE) 100 MG capsule Take 1 capsule (100 mg total) by mouth daily. 05/30/17   Georgetta Haber, NP  ketotifen (ZADITOR) 0.025 % ophthalmic solution Place 1 drop into both eyes 2 (two) times daily. 11/06/17   Georgetta Haber, NP  naproxen (NAPROSYN) 500 MG tablet Take 1 tablet (500 mg total) by mouth 2 (two) times daily with a meal. 07/13/18 07/13/19  Myra Rude, MD  ranitidine (ZANTAC) 150 MG capsule Take 1 capsule (150 mg total) by mouth daily. 07/07/17   Mardella Layman, MD    Family History Family History  Family history unknown: Yes    Social History Social History   Tobacco Use  . Smoking status: Never Smoker  . Smokeless tobacco: Never Used  Substance Use Topics  . Alcohol use: Yes  . Drug use: Not on file     Allergies   Patient has  no known allergies.   Review of Systems Review of Systems  Constitutional: Negative for chills and fever.  HENT: Negative for congestion.   Eyes: Negative for visual disturbance.  Respiratory: Negative for shortness of breath.   Cardiovascular: Negative for chest pain.  Gastrointestinal: Negative for abdominal pain and vomiting.  Genitourinary: Positive for flank pain. Negative for dysuria.  Musculoskeletal: Negative for back pain, neck pain and neck stiffness.  Skin: Negative for rash.  Neurological: Negative for light-headedness and headaches.     Physical Exam Updated Vital Signs BP 118/74 (BP Location: Right Arm)   Pulse 75   Temp 97.9 F (36.6 C) (Oral)   Resp 16   SpO2 97%   Physical Exam Vitals signs and nursing note reviewed.  Constitutional:      Appearance: He is well-developed.  HENT:     Head: Normocephalic and atraumatic.  Eyes:     General:        Right eye: No discharge.        Left eye: No discharge.     Conjunctiva/sclera: Conjunctivae normal.  Neck:     Musculoskeletal: Normal range of motion.     Trachea: No tracheal deviation.  Cardiovascular:     Rate and Rhythm: Normal rate.  Pulmonary:     Effort: Pulmonary effort is normal.     Breath sounds: Normal breath  sounds.  Abdominal:     General: There is no distension.     Palpations: Abdomen is soft.     Tenderness: There is no abdominal tenderness. There is no guarding.  Musculoskeletal:        General: Tenderness (right mid/ anterior flank lower ribs) present.  Skin:    General: Skin is warm.     Findings: No rash.  Neurological:     Mental Status: He is alert and oriented to person, place, and time.      ED Treatments / Results  Labs (all labs ordered are listed, but only abnormal results are displayed) Labs Reviewed  BASIC METABOLIC PANEL - Abnormal; Notable for the following components:      Result Value   Glucose, Bld 102 (*)    BUN <5 (*)    Creatinine, Ser 0.54 (*)    All  other components within normal limits  CBC - Abnormal; Notable for the following components:   WBC 3.8 (*)    RBC 5.91 (*)    Hemoglobin 17.1 (*)    All other components within normal limits  URINALYSIS, ROUTINE W REFLEX MICROSCOPIC    EKG None  Radiology No results found.  Procedures Procedures (including critical care time)  Medications Ordered in ED Medications  ibuprofen (ADVIL,MOTRIN) tablet 600 mg (600 mg Oral Given 09/05/18 2125)     Initial Impression / Assessment and Plan / ED Course  I have reviewed the triage vital signs and the nursing notes.  Pertinent labs & imaging results that were available during my care of the patient were reviewed by me and considered in my medical decision making (see chart for details).       Patient presents with right flank pain likely musculoskeletal.  No abdominal tenderness, no rash, no fevers, urinalysis delayed and results reviewed no acute infection or bleeding.  Blood work reviewed overall reassuring with normal creatinine.   Results and differential diagnosis were discussed with the patient/parent/guardian. Xrays were independently reviewed by myself.  Close follow up outpatient was discussed, comfortable with the plan.   Medications  ibuprofen (ADVIL,MOTRIN) tablet 600 mg (600 mg Oral Given 09/05/18 2125)    Vitals:   09/05/18 1738 09/05/18 2103 09/05/18 2254  BP: 118/80 123/88 118/74  Pulse: 74 66 75  Resp: 16 18 16   Temp: 97.9 F (36.6 C)    TempSrc: Oral    SpO2: 100% 99% 97%    Final diagnoses:  Right flank pain     Final Clinical Impressions(s) / ED Diagnoses   Final diagnoses:  Right flank pain    ED Discharge Orders    None       Blane Ohara, MD 09/05/18 2356

## 2018-10-15 ENCOUNTER — Ambulatory Visit (HOSPITAL_COMMUNITY)
Admission: EM | Admit: 2018-10-15 | Discharge: 2018-10-15 | Disposition: A | Discharge status: Home / Self Care | Payer: Medicaid Other | Attending: Internal Medicine | Admitting: Internal Medicine

## 2018-10-15 ENCOUNTER — Encounter (HOSPITAL_COMMUNITY): Payer: Self-pay

## 2018-10-15 ENCOUNTER — Other Ambulatory Visit: Payer: Self-pay

## 2018-10-15 DIAGNOSIS — R509 Fever, unspecified: Secondary | ICD-10-CM

## 2018-10-15 DIAGNOSIS — R51 Headache: Secondary | ICD-10-CM

## 2018-10-15 DIAGNOSIS — N3001 Acute cystitis with hematuria: Secondary | ICD-10-CM | POA: Insufficient documentation

## 2018-10-15 LAB — POCT URINALYSIS DIP (DEVICE)
Bilirubin Urine: NEGATIVE
Glucose, UA: NEGATIVE mg/dL
Ketones, ur: NEGATIVE mg/dL
Nitrite: NEGATIVE
Protein, ur: 100 mg/dL — AB
Specific Gravity, Urine: 1.02 (ref 1.005–1.030)
Urobilinogen, UA: 1 mg/dL (ref 0.0–1.0)
pH: >=9 (ref 5.0–8.0)

## 2018-10-15 MED ORDER — ACETAMINOPHEN 500 MG PO TABS
1000.0000 mg | ORAL_TABLET | Freq: Once | ORAL | Status: AC
  Administered 2018-10-15: 1000 mg via ORAL

## 2018-10-15 MED ORDER — ACETAMINOPHEN 325 MG PO TABS
ORAL_TABLET | ORAL | Status: AC
  Filled 2018-10-15: qty 3

## 2018-10-15 MED ORDER — SULFAMETHOXAZOLE-TRIMETHOPRIM 800-160 MG PO TABS: 1.0000 | ORAL_TABLET | Freq: Two times a day (BID) | ORAL | 0 refills | Status: AC

## 2018-10-15 MED ORDER — CEFTRIAXONE SODIUM 1 G IJ SOLR
INTRAMUSCULAR | Status: AC
  Filled 2018-10-15: qty 10

## 2018-10-15 MED ORDER — CEFTRIAXONE SODIUM 1 G IJ SOLR
1.0000 g | Freq: Once | INTRAMUSCULAR | Status: AC
  Administered 2018-10-15: 1 g via INTRAMUSCULAR

## 2018-10-15 NOTE — ED Triage Notes (Signed)
Patient presents to Urgent Care with complaints of painful urination since yesterday. Patient states he also has frequency and hesitation, temp 103.0 during triage.

## 2018-10-15 NOTE — ED Notes (Signed)
Patient verbalizes understanding of discharge instructions. Opportunity for questioning and answers were provided. Patient discharged from UCC by CMA.  

## 2018-10-15 NOTE — ED Provider Notes (Signed)
[facility]    CSN: [id number] Arrival date & time: 10/15/18  1315     History   Chief Complaint Chief Complaint  Patient presents with  . Painful Urination    HPI Kyle Morris is a 28 y.o. male.   Patient is a 28 year old male that presents today with approximately 1 to 2 days of dysuria, groin pain, urinary retention, fever, headache.  Symptoms have been constant.  He has been taking Tylenol for the fever and headache.  Denies any penile discharge.  Reports being currently sexually active with his wife and denies any concern for STDs.  He has had mild back pain without flank pain.  Denies any hematuria.  Denies any testicle pain, swelling, lesions. No hx of kidney stones.   ROS per HPI      History reviewed. No pertinent past medical history.  There are no active problems to display for this patient.   History reviewed. No pertinent surgical history.     Home Medications    Prior to Admission medications   Medication Sig Start Date End Date Taking? Authorizing Provider  diclofenac (VOLTAREN) 75 MG EC tablet Take 1 tablet (75 mg total) by mouth 2 (two) times daily. 07/07/17   Mardella Layman, MD  docusate sodium (COLACE) 100 MG capsule Take 1 capsule (100 mg total) by mouth daily. 05/30/17   Georgetta Haber, NP  ketotifen (ZADITOR) 0.025 % ophthalmic solution Place 1 drop into both eyes 2 (two) times daily. 11/06/17   Georgetta Haber, NP  naproxen (NAPROSYN) 500 MG tablet Take 1 tablet (500 mg total) by mouth 2 (two) times daily with a meal. 07/13/18 07/13/19  Myra Rude, MD  ranitidine (ZANTAC) 150 MG capsule Take 1 capsule (150 mg total) by mouth daily. 07/07/17   Mardella Layman, MD  sulfamethoxazole-trimethoprim (BACTRIM DS) 800-160 MG tablet Take 1 tablet by mouth 2 (two) times daily for 7 days. 10/15/18 10/22/18  Janace Aris, NP    Family History Family History  Family history unknown: Yes    Social History Social History   Tobacco Use   . Smoking status: Never Smoker  . Smokeless tobacco: Never Used  Substance Use Topics  . Alcohol use: Yes    Comment: socially  . Drug use: Not on file     Allergies   Patient has no known allergies.   Review of Systems Review of Systems   Physical Exam Triage Vital Signs ED Triage Vitals  Enc Vitals Group     BP 10/15/18 1357 118/76     Pulse Rate 10/15/18 1357 (!) 110     Resp 10/15/18 1357 16     Temp 10/15/18 1357 (!) 103 F (39.4 C)     Temp Source 10/15/18 1357 Oral     SpO2 10/15/18 1357 98 %     Weight --      Height --      Head Circumference --      Peak Flow --      Pain Score 10/15/18 1355 8     Pain Loc --      Pain Edu? --      Excl. in GC? --    No data found.  Updated Vital Signs BP 118/76 (BP Location: Right Arm)   Pulse (!) 110 Comment: Nurse Idelia Salm notitified of HR  Temp (!) 103 F (39.4 C) (Oral) Comment: Nurse Idelia Salm notitified of Temperature  Resp 16   SpO2 98%  Visual Acuity Right Eye Distance:   Left Eye Distance:   Bilateral Distance:    Right Eye Near:   Left Eye Near:    Bilateral Near:     Physical Exam Vitals signs and nursing note reviewed.  Constitutional:      General: He is not in acute distress.    Appearance: Normal appearance. He is not ill-appearing, toxic-appearing or diaphoretic.  HENT:     Head: Normocephalic and atraumatic.     Nose: Nose normal.  Eyes:     Conjunctiva/sclera: Conjunctivae normal.  Neck:     Musculoskeletal: Normal range of motion.  Pulmonary:     Effort: Pulmonary effort is normal.  Abdominal:     Tenderness: There is no right CVA tenderness or left CVA tenderness.  Musculoskeletal: Normal range of motion.  Skin:    General: Skin is warm and dry.  Neurological:     Mental Status: He is alert.  Psychiatric:        Mood and Affect: Mood normal.      UC Treatments / Results  Labs (all labs ordered are listed, but only abnormal results are displayed) Labs Reviewed   POCT URINALYSIS DIP (DEVICE) - Abnormal; Notable for the following components:      Result Value   Hgb urine dipstick LARGE (*)    Protein, ur 100 (*)    Leukocytes,Ua LARGE (*)    All other components within normal limits  URINE CULTURE  URINE CYTOLOGY ANCILLARY ONLY    EKG None  Radiology No results found.  Procedures Procedures (including critical care time)  Medications Ordered in UC Medications  acetaminophen (TYLENOL) tablet 1,000 mg (1,000 mg Oral Given 10/15/18 1453)  cefTRIAXone (ROCEPHIN) injection 1 g (1 g Intramuscular Given 10/15/18 1454)    Initial Impression / Assessment and Plan / UC Course  I have reviewed the triage vital signs and the nursing notes.  Pertinent labs & imaging results that were available during my care of the patient were reviewed by me and considered in my medical decision making (see chart for details).    Acute cystitis  Urine revealed large leuks and large blood. Based on symptoms and positive results we will go ahead and treat for urinary tract infection Sending urine for culture and cytology. Other differentials includes prostatitis, epididymitis, STD but less likely. 1 g of Rocephin given here in clinic along with 1000 of Tylenol for fever and headache Sending home with Bactrim to take for the next week Instructed to drink plenty of water and strict precautions and instructions that if his symptoms continue or worsen over the next 24 to 48 hours he will need to go to the ER Patient understanding and agree to plan Final Clinical Impressions(s) / UC Diagnoses   Final diagnoses:  Acute cystitis with hematuria     Discharge Instructions     We are treating you for a urinary tract infection. Injection of antibiotics given here in clinic along with Tylenol for fever and headache Sending a prescription for more antibiotics to the pharmacy to take by mouth twice a day for the next 7 days Make sure you are drinking lots and lots of  water If your symptoms continue or worsen over the next 24 to 48 hours despite treatment you need to go to the ER for further evaluation and management    ED Prescriptions    Medication Sig Dispense Auth. Provider   sulfamethoxazole-trimethoprim (BACTRIM DS) 800-160 MG tablet Take 1 tablet by  mouth 2 (two) times daily for 7 days. 14 tablet Dahlia Byes A, NP     Controlled Substance Prescriptions [place] Controlled Substance Registry consulted? Not Applicable   Janace Aris, NP 10/15/18 1513

## 2018-10-15 NOTE — Discharge Instructions (Signed)
We are treating you for a urinary tract infection. Injection of antibiotics given here in clinic along with Tylenol for fever and headache Sending a prescription for more antibiotics to the pharmacy to take by mouth twice a day for the next 7 days Make sure you are drinking lots and lots of water If your symptoms continue or worsen over the next 24 to 48 hours despite treatment you need to go to the ER for further evaluation and management

## 2018-10-17 LAB — URINE CULTURE: Culture: >=100000 — AB

## 2018-10-17 LAB — URINE CYTOLOGY ANCILLARY ONLY
Chlamydia: NEGATIVE
Neisseria Gonorrhea: NEGATIVE
Trichomonas: NEGATIVE

## 2018-10-19 ENCOUNTER — Telehealth (HOSPITAL_COMMUNITY): Payer: Self-pay | Admitting: Emergency Medicine

## 2018-10-19 MED ORDER — NITROFURANTOIN MONOHYD MACRO 100 MG PO CAPS: 100.0000 mg | ORAL_CAPSULE | Freq: Two times a day (BID) | ORAL | 0 refills | Status: DC

## 2018-10-19 NOTE — Telephone Encounter (Signed)
Urine culture was positive for resistant to bactrim given at urgent care visit. Prescription for macrobid per protocol sent to pharmacy of choice. Pt called and made aware. Pt educated to follow up if symptoms are not improving. Verbalized understanding.

## 2018-10-23 ENCOUNTER — Emergency Department (HOSPITAL_COMMUNITY)
Admission: EM | Admit: 2018-10-23 | Discharge: 2018-10-23 | Disposition: A | Discharge status: Home / Self Care | Payer: Self-pay | Attending: Emergency Medicine | Admitting: Emergency Medicine

## 2018-10-23 ENCOUNTER — Emergency Department (HOSPITAL_COMMUNITY): Payer: Self-pay

## 2018-10-23 ENCOUNTER — Other Ambulatory Visit: Payer: Self-pay

## 2018-10-23 ENCOUNTER — Encounter (HOSPITAL_COMMUNITY): Payer: Self-pay | Admitting: Emergency Medicine

## 2018-10-23 DIAGNOSIS — R05 Cough: Secondary | ICD-10-CM | POA: Insufficient documentation

## 2018-10-23 DIAGNOSIS — R748 Abnormal levels of other serum enzymes: Secondary | ICD-10-CM | POA: Insufficient documentation

## 2018-10-23 DIAGNOSIS — R300 Dysuria: Secondary | ICD-10-CM | POA: Insufficient documentation

## 2018-10-23 DIAGNOSIS — R519 Headache, unspecified: Secondary | ICD-10-CM

## 2018-10-23 DIAGNOSIS — R200 Anesthesia of skin: Secondary | ICD-10-CM | POA: Insufficient documentation

## 2018-10-23 DIAGNOSIS — R51 Headache: Secondary | ICD-10-CM | POA: Insufficient documentation

## 2018-10-23 DIAGNOSIS — Z87440 Personal history of urinary (tract) infections: Secondary | ICD-10-CM | POA: Insufficient documentation

## 2018-10-23 DIAGNOSIS — R0602 Shortness of breath: Secondary | ICD-10-CM | POA: Insufficient documentation

## 2018-10-23 LAB — URINALYSIS, ROUTINE W REFLEX MICROSCOPIC
Bilirubin Urine: NEGATIVE
Glucose, UA: NEGATIVE mg/dL
Hgb urine dipstick: NEGATIVE
Ketones, ur: 20 mg/dL — AB
Leukocytes,Ua: NEGATIVE
Nitrite: NEGATIVE
Protein, ur: NEGATIVE mg/dL
Specific Gravity, Urine: 1.023 (ref 1.005–1.030)
pH: 6 (ref 5.0–8.0)

## 2018-10-23 LAB — COMPREHENSIVE METABOLIC PANEL WITH GFR
ALT: 16 U/L (ref 0–44)
AST: 36 U/L (ref 15–41)
Albumin: 3.8 g/dL (ref 3.5–5.0)
Alkaline Phosphatase: 45 U/L (ref 38–126)
Anion gap: 11 (ref 5–15)
BUN: 6 mg/dL (ref 6–20)
CO2: 24 mmol/L (ref 22–32)
Calcium: 8.9 mg/dL (ref 8.9–10.3)
Chloride: 101 mmol/L (ref 98–111)
Creatinine, Ser: 0.71 mg/dL (ref 0.61–1.24)
GFR calc Af Amer: >60 mL/min
GFR calc non Af Amer: >60 mL/min
Glucose, Bld: 87 mg/dL (ref 70–99)
Potassium: 5.1 mmol/L (ref 3.5–5.1)
Sodium: 136 mmol/L (ref 135–145)
Total Bilirubin: 1.6 mg/dL — ABNORMAL HIGH (ref 0.3–1.2)
Total Protein: 7 g/dL (ref 6.5–8.1)

## 2018-10-23 LAB — CBC WITH DIFFERENTIAL/PLATELET
Abs Immature Granulocytes: 0.02 10*3/uL (ref 0.00–0.07)
Basophils Absolute: 0 10*3/uL (ref 0.0–0.1)
Basophils Relative: 0 %
Eosinophils Absolute: 0 10*3/uL (ref 0.0–0.5)
Eosinophils Relative: 0 %
HCT: 45.8 % (ref 39.0–52.0)
Hemoglobin: 15.5 g/dL (ref 13.0–17.0)
Immature Granulocytes: 1 %
Lymphocytes Relative: 21 %
Lymphs Abs: 0.7 10*3/uL (ref 0.7–4.0)
MCH: 28.9 pg (ref 26.0–34.0)
MCHC: 33.8 g/dL (ref 30.0–36.0)
MCV: 85.4 fL (ref 80.0–100.0)
Monocytes Absolute: 0.3 10*3/uL (ref 0.1–1.0)
Monocytes Relative: 8 %
Neutro Abs: 2.2 10*3/uL (ref 1.7–7.7)
Neutrophils Relative %: 70 %
Platelets: 102 10*3/uL — ABNORMAL LOW (ref 150–400)
RBC: 5.36 MIL/uL (ref 4.22–5.81)
RDW: 12.3 % (ref 11.5–15.5)
WBC: 3.2 10*3/uL — ABNORMAL LOW (ref 4.0–10.5)
nRBC: 0 % (ref 0.0–0.2)

## 2018-10-23 LAB — LIPASE, BLOOD: Lipase: 78 U/L — ABNORMAL HIGH (ref 11–51)

## 2018-10-23 MED ORDER — METOCLOPRAMIDE HCL 5 MG/ML IJ SOLN
10.0000 mg | Freq: Once | INTRAMUSCULAR | Status: AC
  Administered 2018-10-23: 10 mg via INTRAVENOUS
  Filled 2018-10-23: qty 2

## 2018-10-23 MED ORDER — METOCLOPRAMIDE HCL 10 MG PO TABS: 10.0000 mg | ORAL_TABLET | Freq: Four times a day (QID) | ORAL | 0 refills | Status: DC | PRN

## 2018-10-23 MED ORDER — SODIUM CHLORIDE 0.9 % IV BOLUS
1000.0000 mL | Freq: Once | INTRAVENOUS | Status: AC
  Administered 2018-10-23: 1000 mL via INTRAVENOUS

## 2018-10-23 MED ORDER — ACETAMINOPHEN 500 MG PO TABS
1000.0000 mg | ORAL_TABLET | Freq: Once | ORAL | Status: AC
  Administered 2018-10-23: 1000 mg via ORAL
  Filled 2018-10-23: qty 2

## 2018-10-23 MED ORDER — DIPHENHYDRAMINE HCL 50 MG/ML IJ SOLN
25.0000 mg | Freq: Once | INTRAMUSCULAR | Status: AC
  Administered 2018-10-23: 17:00:00 25 mg via INTRAVENOUS
  Filled 2018-10-23: qty 1

## 2018-10-23 NOTE — ED Provider Notes (Signed)
[facility]   CSN: [id number] Arrival date & time: 10/23/18  1433    History   Chief Complaint Chief Complaint  Patient presents with  . Headache    HPI Kyle Morris is a 28 y.o. male here presenting with headaches, right arm numbness, shortness of breath.  Patient states that he has been having headaches on the right side for the last week or so.  He went to urgent care for a week and he had some headaches then and he also has some dysuria and an E. Coli  in his urine.  He was initially prescribed Bactrim but the bacteria was resistant so he was switched to Macrobid.  Patient states that he has persistent right-sided headaches as well as some numbness in the right arm.  Patient denies any weakness or trouble speaking.  Patient also has some low-grade temperature and have some subjective shortness of breath.  Patient denies any cough or actual fever.     The history is provided by the patient.    History reviewed. No pertinent past medical history.  There are no active problems to display for this patient.   No past surgical history on file.      Home Medications    Prior to Admission medications   Medication Sig Start Date End Date Taking? Authorizing Provider  diclofenac (VOLTAREN) 75 MG EC tablet Take 1 tablet (75 mg total) by mouth 2 (two) times daily. 07/07/17   Mardella LaymanHagler, Brian, MD  docusate sodium (COLACE) 100 MG capsule Take 1 capsule (100 mg total) by mouth daily. 05/30/17   Georgetta HaberBurky, Natalie B, NP  ketotifen (ZADITOR) 0.025 % ophthalmic solution Place 1 drop into both eyes 2 (two) times daily. 11/06/17   Georgetta HaberBurky, Natalie B, NP  naproxen (NAPROSYN) 500 MG tablet Take 1 tablet (500 mg total) by mouth 2 (two) times daily with a meal. 07/13/18 07/13/19  Myra RudeSchmitz, Jeremy E, MD  nitrofurantoin, macrocrystal-monohydrate, (MACROBID) 100 MG capsule Take 1 capsule (100 mg total) by mouth 2 (two) times daily. 10/19/18   Eustace MooreNelson,  Yvonne Sue, MD  ranitidine (ZANTAC) 150 MG capsule Take 1 capsule (150 mg total) by mouth daily. 07/07/17   Mardella LaymanHagler, Brian, MD    Family History Family History  Family history unknown: Yes    Social History Social History   Tobacco Use  . Smoking status: Never Smoker  . Smokeless tobacco: Never Used  Substance Use Topics  . Alcohol use: Yes    Comment: socially  . Drug use: Not on file     Allergies   Patient has no known allergies.   Review of Systems Review of Systems  Respiratory: Positive for cough.   Neurological: Positive for headaches.  All other systems reviewed and are negative.    Physical Exam Updated Vital Signs BP (!) 129/93 (BP Location: Right Arm)   Pulse 86   Temp 98.5 F (36.9 C) (Oral)   Resp 14   SpO2 100%   Physical Exam Vitals signs and nursing note reviewed.  Constitutional:      Appearance: He is well-developed.  HENT:     Head: Normocephalic.     Mouth/Throat:     Mouth: Mucous membranes are moist.  Eyes:     Extraocular Movements: Extraocular movements intact.     Pupils: Pupils are equal, round, and reactive to light.  Neck:     Musculoskeletal: Normal range of motion and neck supple.  Cardiovascular:  Rate and Rhythm: Normal rate and regular rhythm.  Pulmonary:     Effort: Pulmonary effort is normal.     Breath sounds: Normal breath sounds.  Abdominal:     General: Bowel sounds are normal.     Palpations: Abdomen is soft.  Musculoskeletal: Normal range of motion.  Skin:    General: Skin is warm.     Capillary Refill: Capillary refill takes less than 2 seconds.  Neurological:     Mental Status: He is alert and oriented to person, place, and time.     Cranial Nerves: No cranial nerve deficit.     Sensory: No sensory deficit.     Motor: No weakness.     Comments: Nl sensation and strength throughout, nl finger to nose bilaterally.   Psychiatric:        Mood and Affect: Mood normal.        Behavior: Behavior normal.       ED Treatments / Results  Labs (all labs ordered are listed, but only abnormal results are displayed) Labs Reviewed  CBC WITH DIFFERENTIAL/PLATELET - Abnormal; Notable for the following components:      Result Value   WBC 3.2 (*)    Platelets 102 (*)    All other components within normal limits  COMPREHENSIVE METABOLIC PANEL - Abnormal; Notable for the following components:   Total Bilirubin 1.6 (*)    All other components within normal limits  LIPASE, BLOOD - Abnormal; Notable for the following components:   Lipase 78 (*)    All other components within normal limits  URINALYSIS, ROUTINE W REFLEX MICROSCOPIC - Abnormal; Notable for the following components:   Ketones, ur 20 (*)    All other components within normal limits  URINE CULTURE    EKG None  Radiology Ct Head Wo Contrast  Result Date: 10/23/2018 CLINICAL DATA:  Headache EXAM: CT HEAD WITHOUT CONTRAST TECHNIQUE: Contiguous axial images were obtained from the base of the skull through the vertex without intravenous contrast. COMPARISON:  None. FINDINGS: Brain: No evidence of acute infarction, hemorrhage, hydrocephalus, extra-axial collection or mass lesion/mass effect. Vascular: No hyperdense vessel or unexpected calcification. Skull: Normal. Negative for fracture or focal lesion. Sinuses/Orbits: No acute finding. Other: None IMPRESSION: Negative non contrasted CT appearance of the brain Electronically Signed   By: Jasmine Pang M.D.   On: 10/23/2018 17:27   Dg Chest Port 1 View  Result Date: 10/23/2018 CLINICAL DATA:  Cough EXAM: PORTABLE CHEST 1 VIEW COMPARISON:  07/07/2017 FINDINGS: The heart size and mediastinal contours are within normal limits. Both lungs are clear. The visualized skeletal structures are unremarkable. IMPRESSION: No active disease. Electronically Signed   By: Jasmine Pang M.D.   On: 10/23/2018 17:54    Procedures Procedures (including critical care time)   EMERGENCY DEPARTMENT BILIARY  ULTRASOUND INTERPRETATION "Study: Limited Abdominal Ultrasound of the Gallbladder and Common Bile Duct."  INDICATIONS: Abdominal pain Indication: Multiple views of the gallbladder and common bile duct were obtained in real-time with a Multi-frequency probe."  PERFORMED BY:  Myself IMAGES ARCHIVED?: Yes LIMITATIONS: Body habitus INTERPRETATION: Normal    Medications Ordered in ED Medications  sodium chloride 0.9 % bolus 1,000 mL (1,000 mLs Intravenous New Bag/Given 10/23/18 1657)  metoCLOPramide (REGLAN) injection 10 mg (10 mg Intravenous Given 10/23/18 1658)  diphenhydrAMINE (BENADRYL) injection 25 mg (25 mg Intravenous Given 10/23/18 1658)  acetaminophen (TYLENOL) tablet 1,000 mg (1,000 mg Oral Given 10/23/18 1646)     Initial Impression / Assessment and Plan /  ED Course  I have reviewed the triage vital signs and the nursing notes.  Pertinent labs & imaging results that were available during my care of the patient were reviewed by me and considered in my medical decision making (see chart for details).       Kyle Morris is a 28 y.o. male here with headaches, subjective numbness, no appetite. He had recent UTI and is on macrobid. Nonfocal neuro exam right now. Will get CT head given persistent headaches. Will get labs, UA, CXR. Will hydrate and give migraine cocktail.    6:24 PM Labs showed slightly elevated lipase of 78. Bili 1.6 but LFTs normal. CT head unremarkable. CXR clear. I performed bedside US and showed no gallstones and nl CBD. I think his minimally elevated lipase is likely secondary to mild gastroenteritis.  His urinalysis is normal and he has very minimal epigastric tenderness.  His headache improved with migraine cocktail.  Will discharge home with Reglan which can help with his nausea as well as headaches.  We will have him see primary care doctor as well as neurologist.  Astrid Drafts was evaluated in Emergency Department on 10/23/2018 for the symptoms  described in the history of present illness. He was evaluated in the context of the global COVID-19 pandemic, which necessitated consideration that the patient might be at risk for infection with the SARS-CoV-2 virus that causes COVID-19. Institutional protocols and algorithms that pertain to the evaluation of patients at risk for COVID-19 are in a state of rapid change based on information released by regulatory bodies including the CDC and federal and state organizations. These policies and algorithms were followed during the patient's care in the ED.    Final Clinical Impressions(s) / ED Diagnoses   Final diagnoses:  None    ED Discharge Orders    None       Charlynne Pander, MD 10/23/18 1825

## 2018-10-23 NOTE — ED Notes (Signed)
Patient transported to CT 

## 2018-10-23 NOTE — Discharge Instructions (Addendum)
Your lipase is slightly elevated likely from a mild stomach virus.   Stay hydrate and take reglan for nausea and headaches   Stay hydrated   See primary care doctor and neurologist   You don't meet testing criteria for COVID but if you have any shortness of breath, please follow COVID guidelines below   Return to ER if you have worse vomiting, headaches, shortness of breath, fever.      Person Under Monitoring Name: Kyle Morris  Location: [address] [place] [address]   Infection Prevention Recommendations for Individuals Confirmed to have, or Being Evaluated for, 2019 Novel Coronavirus (COVID-19) Infection Who Receive Care at Home  Individuals who are confirmed to have, or are being evaluated for, COVID-19 should follow the prevention steps below until a healthcare provider or local or state health department says they can return to normal activities.  Stay home except to get medical care You should restrict activities outside your home, except for getting medical care. Do not go to work, school, or public areas, and do not use public transportation or taxis.  Call ahead before visiting your doctor Before your medical appointment, call the healthcare provider and tell them that you have, or are being evaluated for, COVID-19 infection. This will help the healthcare providers office take steps to keep other people from getting infected. Ask your healthcare provider to call the local or state health department.  Monitor your symptoms Seek prompt medical attention if your illness is worsening (e.g., difficulty breathing). Before going to your medical appointment, call the healthcare provider and tell them that you have, or are being evaluated for, COVID-19 infection. Ask your healthcare provider to call the local or state health department.  Wear a facemask You should wear a facemask that covers your nose and mouth when you are in the same room with other people  and when you visit a healthcare provider. People who live with or visit you should also wear a facemask while they are in the same room with you.  Separate yourself from other people in your home As much as possible, you should stay in a different room from other people in your home. Also, you should use a separate bathroom, if available.  Avoid sharing household items You should not share dishes, drinking glasses, cups, eating utensils, towels, bedding, or other items with other people in your home. After using these items, you should wash them thoroughly with soap and water.  Cover your coughs and sneezes Cover your mouth and nose with a tissue when you cough or sneeze, or you can cough or sneeze into your sleeve. Throw used tissues in a lined trash can, and immediately wash your hands with soap and water for at least 20 seconds or use an alcohol-based hand rub.  Wash your [employer] your hands often and thoroughly with soap and water for at least 20 seconds. You can use an alcohol-based hand sanitizer if soap and water are not available and if your hands are not visibly dirty. Avoid touching your eyes, nose, and mouth with unwashed hands.   Prevention Steps for Caregivers and Household Members of Individuals Confirmed to have, or Being Evaluated for, COVID-19 Infection Being Cared for in the Home  If you live with, or provide care at home for, a person confirmed to have, or being evaluated for, COVID-19 infection please follow these guidelines to prevent infection:  Follow healthcare providers instructions Make sure that you understand and can help the patient follow  any healthcare provider instructions for all care.  Provide for the patients basic needs You should help the patient with basic needs in the home and provide support for getting groceries, prescriptions, and other personal needs.  Monitor the patients symptoms If they are getting sicker, call his or her medical  provider and tell them that the patient has, or is being evaluated for, COVID-19 infection. This will help the healthcare providers office take steps to keep other people from getting infected. Ask the healthcare provider to call the local or state health department.  Limit the number of people who have contact with the patient If possible, have only one caregiver for the patient. Other household members should stay in another home or place of residence. If this is not possible, they should stay in another room, or be separated from the patient as much as possible. Use a separate bathroom, if available. Restrict visitors who do not have an essential need to be in the home.  Keep older adults, very young children, and other sick people away from the patient Keep older adults, very young children, and those who have compromised immune systems or chronic health conditions away from the patient. This includes people with chronic heart, lung, or kidney conditions, diabetes, and cancer.  Ensure good ventilation Make sure that shared spaces in the home have good air flow, such as from an air conditioner or an opened window, weather permitting.  Wash your hands often Wash your hands often and thoroughly with soap and water for at least 20 seconds. You can use an alcohol based hand sanitizer if soap and water are not available and if your hands are not visibly dirty. Avoid touching your eyes, nose, and mouth with unwashed hands. Use disposable paper towels to dry your hands. If not available, use dedicated cloth towels and replace them when they become wet.  Wear a facemask and gloves Wear a disposable facemask at all times in the room and gloves when you touch or have contact with the patients blood, body fluids, and/or secretions or excretions, such as sweat, saliva, sputum, nasal mucus, vomit, urine, or feces.  Ensure the mask fits over your nose and mouth tightly, and do not touch it during  use. Throw out disposable facemasks and gloves after using them. Do not reuse. Wash your hands immediately after removing your facemask and gloves. If your personal clothing becomes contaminated, carefully remove clothing and launder. Wash your hands after handling contaminated clothing. Place all used disposable facemasks, gloves, and other waste in a lined container before disposing them with other household waste. Remove gloves and wash your hands immediately after handling these items.  Do not share dishes, glasses, or other household items with the patient Avoid sharing household items. You should not share dishes, drinking glasses, cups, eating utensils, towels, bedding, or other items with a patient who is confirmed to have, or being evaluated for, COVID-19 infection. After the person uses these items, you should wash them thoroughly with soap and water.  Wash laundry thoroughly Immediately remove and wash clothes or bedding that have blood, body fluids, and/or secretions or excretions, such as sweat, saliva, sputum, nasal mucus, vomit, urine, or feces, on them. Wear gloves when handling laundry from the patient. Read and follow directions on labels of laundry or clothing items and detergent. In general, wash and dry with the warmest temperatures recommended on the label.  Clean all areas the individual has used often Clean all touchable surfaces, such as counters,  tabletops, doorknobs, bathroom fixtures, toilets, phones, keyboards, tablets, and bedside tables, every day. Also, clean any surfaces that may have blood, body fluids, and/or secretions or excretions on them. Wear gloves when cleaning surfaces the patient has come in contact with. Use a diluted bleach solution (e.g., dilute bleach with 1 part bleach and 10 parts water) or a household disinfectant with a label that says EPA-registered for coronaviruses. To make a bleach solution at home, add 1 tablespoon of bleach to 1 quart (4  cups) of water. For a larger supply, add  cup of bleach to 1 gallon (16 cups) of water. Read labels of cleaning products and follow recommendations provided on product labels. Labels contain instructions for safe and effective use of the cleaning product including precautions you should take when applying the product, such as wearing gloves or eye protection and making sure you have good ventilation during use of the product. Remove gloves and wash hands immediately after cleaning.  Monitor yourself for signs and symptoms of illness Caregivers and household members are considered close contacts, should monitor their health, and will be asked to limit movement outside of the home to the extent possible. Follow the monitoring steps for close contacts listed on the symptom monitoring form.   ? If you have additional questions, contact your local health department or call the epidemiologist on call at [phone] (available 24/7). ? This guidance is subject to change. For the most up-to-date guidance from [facility], please refer to their website: TripMetro.huhttps://www.cdc.gov/coronavirus/2019-ncov/hcp/guidance-prevent-spread.html

## 2018-10-23 NOTE — ED Triage Notes (Signed)
Pt states for over one week he has felt "bad" with a constant headache and no appetite. Pt denies any abd pain, n/v/d. Pt denies any fevers, cough cp or sob.

## 2018-10-24 LAB — URINE CULTURE: Culture: NO GROWTH

## 2018-12-16 ENCOUNTER — Emergency Department (HOSPITAL_COMMUNITY): Payer: Self-pay

## 2018-12-16 ENCOUNTER — Other Ambulatory Visit: Payer: Self-pay

## 2018-12-16 ENCOUNTER — Emergency Department (HOSPITAL_COMMUNITY)
Admission: EM | Admit: 2018-12-16 | Discharge: 2018-12-16 | Disposition: A | Discharge status: Home / Self Care | Payer: Self-pay | Attending: Emergency Medicine | Admitting: Emergency Medicine

## 2018-12-16 ENCOUNTER — Encounter (HOSPITAL_COMMUNITY): Payer: Self-pay | Admitting: Emergency Medicine

## 2018-12-16 DIAGNOSIS — R0789 Other chest pain: Secondary | ICD-10-CM | POA: Insufficient documentation

## 2018-12-16 DIAGNOSIS — Z79899 Other long term (current) drug therapy: Secondary | ICD-10-CM | POA: Insufficient documentation

## 2018-12-16 LAB — CBC
HCT: 49.1 % (ref 39.0–52.0)
Hemoglobin: 16.4 g/dL (ref 13.0–17.0)
MCH: 29.1 pg (ref 26.0–34.0)
MCHC: 33.4 g/dL (ref 30.0–36.0)
MCV: 87.1 fL (ref 80.0–100.0)
Platelets: 235 10*3/uL (ref 150–400)
RBC: 5.64 MIL/uL (ref 4.22–5.81)
RDW: 14.1 % (ref 11.5–15.5)
WBC: 3.4 10*3/uL — ABNORMAL LOW (ref 4.0–10.5)
nRBC: 0 % (ref 0.0–0.2)

## 2018-12-16 LAB — URINALYSIS, ROUTINE W REFLEX MICROSCOPIC
Bacteria, UA: NONE SEEN
Bilirubin Urine: NEGATIVE
Glucose, UA: NEGATIVE mg/dL
Hgb urine dipstick: NEGATIVE
Ketones, ur: NEGATIVE mg/dL
Leukocytes,Ua: NEGATIVE
Nitrite: NEGATIVE
Protein, ur: 30 mg/dL — AB
Specific Gravity, Urine: 1.016 (ref 1.005–1.030)
pH: 9 — ABNORMAL HIGH (ref 5.0–8.0)

## 2018-12-16 LAB — BASIC METABOLIC PANEL
Anion gap: 8 (ref 5–15)
BUN: <5 mg/dL — ABNORMAL LOW (ref 6–20)
CO2: 27 mmol/L (ref 22–32)
Calcium: 9.4 mg/dL (ref 8.9–10.3)
Chloride: 103 mmol/L (ref 98–111)
Creatinine, Ser: 0.56 mg/dL — ABNORMAL LOW (ref 0.61–1.24)
GFR calc Af Amer: >60 mL/min (ref >60)
GFR calc non Af Amer: >60 mL/min (ref >60)
Glucose, Bld: 98 mg/dL (ref 70–99)
Potassium: 3.9 mmol/L (ref 3.5–5.1)
Sodium: 138 mmol/L (ref 135–145)

## 2018-12-16 LAB — HEPATIC FUNCTION PANEL
ALT: 21 U/L (ref 0–44)
AST: 33 U/L (ref 15–41)
Albumin: 4.2 g/dL (ref 3.5–5.0)
Alkaline Phosphatase: 59 U/L (ref 38–126)
Bilirubin, Direct: 0.3 mg/dL — ABNORMAL HIGH (ref 0.0–0.2)
Indirect Bilirubin: 1.1 mg/dL — ABNORMAL HIGH (ref 0.3–0.9)
Total Bilirubin: 1.4 mg/dL — ABNORMAL HIGH (ref 0.3–1.2)
Total Protein: 7.2 g/dL (ref 6.5–8.1)

## 2018-12-16 LAB — D-DIMER, QUANTITATIVE: D-Dimer, Quant: <0.27 ug/mL-FEU (ref 0.00–0.50)

## 2018-12-16 LAB — LIPASE, BLOOD: Lipase: 33 U/L (ref 11–51)

## 2018-12-16 MED ORDER — NAPROXEN 375 MG PO TABS: 375.0000 mg | ORAL_TABLET | Freq: Two times a day (BID) | ORAL | 0 refills | Status: AC

## 2018-12-16 MED ORDER — KETOROLAC TROMETHAMINE 15 MG/ML IJ SOLN
15.0000 mg | Freq: Once | INTRAMUSCULAR | Status: AC
  Administered 2018-12-16: 15 mg via INTRAVENOUS
  Filled 2018-12-16: qty 1

## 2018-12-16 NOTE — ED Provider Notes (Signed)
[facility] Provider Note   CSN: [id number] Arrival date & time: 12/16/18  1615    History   Chief Complaint Chief Complaint  Patient presents with  . Flank Pain   Phone interpreter was used throughout this evaluation.  HPI Kyle Morris is a 28 y.o. male.     HPI   Patient is a 28 year old male who presents the emergency department today complaining of pain to the right side of his body from his chest down to his right flank area.  States that has been ongoing for a long time and it had been getting better however the last few days it seems to have been worse.  Pain has been waxing and waning.  He denies any recent falls or injuries.  States it hurts more when he yawns, sneezes, breathes or turns a specific way.  He denies any chest pain.  Denies any significant abdominal pain, nausea, vomiting, diarrhea or urinary symptoms.  Denies any cough or fevers.  Denies any lower extremity swelling, calf pain, hemoptysis, recent admissions/surgeries, extended periods of travel, history of VTE, or history of cancer.  History reviewed. No pertinent past medical history.  There are no active problems to display for this patient.   History reviewed. No pertinent surgical history.      Home Medications    Prior to Admission medications   Medication Sig Start Date End Date Taking? Authorizing Provider  diclofenac (VOLTAREN) 75 MG EC tablet Take 1 tablet (75 mg total) by mouth 2 (two) times daily. 07/07/17   Mardella LaymanHagler, Brian, MD  docusate sodium (COLACE) 100 MG capsule Take 1 capsule (100 mg total) by mouth daily. 05/30/17   Georgetta HaberBurky, Natalie B, NP  ketotifen (ZADITOR) 0.025 % ophthalmic solution Place 1 drop into both eyes 2 (two) times daily. 11/06/17   Georgetta HaberBurky, Natalie B, NP  metoCLOPramide (REGLAN) 10 MG tablet Take 1 tablet (10 mg total) by mouth every 6 (six) hours as needed for nausea (nausea/headache). 10/23/18   Charlynne PanderYao, David Hsienta, MD  naproxen  (NAPROSYN) 375 MG tablet Take 1 tablet (375 mg total) by mouth 2 (two) times daily for 7 days. 12/16/18 12/23/18  David Rodriquez S, PA-C  nitrofurantoin, macrocrystal-monohydrate, (MACROBID) 100 MG capsule Take 1 capsule (100 mg total) by mouth 2 (two) times daily. 10/19/18   Eustace MooreNelson, Yvonne Sue, MD  ranitidine (ZANTAC) 150 MG capsule Take 1 capsule (150 mg total) by mouth daily. 07/07/17   Mardella LaymanHagler, Brian, MD    Family History Family History  Family history unknown: Yes    Social History Social History   Tobacco Use  . Smoking status: Never Smoker  . Smokeless tobacco: Never Used  Substance Use Topics  . Alcohol use: Yes    Comment: socially  . Drug use: Not on file     Allergies   Patient has no known allergies.   Review of Systems Review of Systems  Constitutional: Negative for fever.  HENT: Negative for ear pain and sore throat.   Eyes: Negative for visual disturbance.  Respiratory: Negative for cough and shortness of breath.   Cardiovascular: Positive for chest pain (right chest ).  Gastrointestinal: Negative for abdominal pain, constipation, diarrhea, nausea and vomiting.  Genitourinary: Positive for flank pain. Negative for dysuria, frequency, hematuria and urgency.  Musculoskeletal: Negative for back pain.  Skin: Negative for rash.  Neurological: Negative for headaches.  All other systems reviewed and are negative.   Physical Exam Updated Vital Signs BP 123/83 (BP Location:  Left Arm)   Pulse 87   Temp 98.5 F (36.9 C) (Oral)   Resp 15   Ht 5\' 4"  (1.626 m)   Wt 65.8 kg   SpO2 100%   BMI 24.89 kg/m   Physical Exam Vitals signs and nursing note reviewed.  Constitutional:      Appearance: He is well-developed.  HENT:     Head: Normocephalic and atraumatic.  Eyes:     Conjunctiva/sclera: Conjunctivae normal.  Neck:     Musculoskeletal: Neck supple.  Cardiovascular:     Rate and Rhythm: Normal rate and regular rhythm.     Pulses: Normal pulses.     Heart  sounds: Normal heart sounds. No murmur.  Pulmonary:     Effort: Pulmonary effort is normal. No respiratory distress.     Breath sounds: Normal breath sounds. No wheezing, rhonchi or rales.     Comments: No chest wall tenderness or crepitus. No rashes to the chest wall Abdominal:     General: Bowel sounds are normal.     Palpations: Abdomen is soft.     Tenderness: There is no abdominal tenderness. There is no right CVA tenderness, left CVA tenderness, guarding or rebound.  Musculoskeletal:     Comments: No midline thoracic spine TTP.   Skin:    General: Skin is warm and dry.  Neurological:     Mental Status: He is alert.     ED Treatments / Results  Labs (all labs ordered are listed, but only abnormal results are displayed) Labs Reviewed  URINALYSIS, ROUTINE W REFLEX MICROSCOPIC - Abnormal; Notable for the following components:      Result Value   APPearance HAZY (*)    pH 9.0 (*)    Protein, ur 30 (*)    All other components within normal limits  BASIC METABOLIC PANEL - Abnormal; Notable for the following components:   BUN <5 (*)    Creatinine, Ser 0.56 (*)    All other components within normal limits  CBC - Abnormal; Notable for the following components:   WBC 3.4 (*)    All other components within normal limits  HEPATIC FUNCTION PANEL - Abnormal; Notable for the following components:   Total Bilirubin 1.4 (*)    Bilirubin, Direct 0.3 (*)    Indirect Bilirubin 1.1 (*)    All other components within normal limits  LIPASE, BLOOD  D-DIMER, QUANTITATIVE (NOT AT [facility])    EKG None  Radiology Dg Ribs Unilateral W/chest Right  Result Date: 12/16/2018 CLINICAL DATA:  Right-sided axillary rib pain for 2 days. No known injury. EXAM: RIGHT RIBS AND CHEST - 3+ VIEW COMPARISON:  10/23/2018 FINDINGS: Frontal chest shows clear lungs. No pneumothorax or pleural effusion. No focal consolidation. The cardiopericardial silhouette is within normal limits for size. Oblique views of the  right ribs obtained. Radio-opaque marker has been placed on the skin at the site of patient concern. No underlying right-sided rib fracture evident. IMPRESSION: Negative. Electronically Signed   By: Misty Stanley M.D.   On: 12/16/2018 18:21    Procedures Procedures (including critical care time)  Medications Ordered in ED Medications  ketorolac (TORADOL) 15 MG/ML injection 15 mg (15 mg Intravenous Given 12/16/18 1821)     Initial Impression / Assessment and Plan / ED Course  I have reviewed the triage vital signs and the nursing notes.  Pertinent labs & imaging results that were available during my care of the patient were reviewed by me and considered in my medical  decision making (see chart for details).     Final Clinical Impressions(s) / ED Diagnoses   Final diagnoses:  Chest wall pain   28 year old male with no significant past medical history presenting emergency department today for evaluation of right-sided chest wall pain and right side pain.  He is not have any reproducible tenderness to the chest wall, right sided paraspinous muscles, right flank or abdomen.  He has no changes to the skin to suggest shingles.  No obvious evidence of trauma.  No crepitus to the chest wall.  Lungs are clear to auscultation bilaterally.  He has no associated GI or GU complaints.  Chest x-ray and x-ray of the right ribs does not show any evidence of fracture or lung abnormality.  Laboratory work obtained, no leukocytosis, anemia, gross electrolyte derangement.  Normal kidney and liver function.  Slight elevation in bilirubin however appears consistent with prior.  I doubt gallbladder pathology as he has no abdominal tenderness on exam.  UA is without evidence of urinary tract infection or hematuria making nephrolithiasis or ureterolithiasis less likely.  He also has no CVA tenderness at all or does not have any urinary complaints.  His d-dimer is negative making PE less likely.  Symptoms are not  consistent with ACS.  I suspect he is either having musculoskeletal chest wall pain or pleurisy.  He has been seen multiple times in the ED for similar complaints for the last several years will treat with anti-inflammatories.  We will have him follow-up with his PCP and return the ER for new or worsening symptoms.  He voiced understanding of plan and reasons to return to the ER.  All questions answered.  Patient stable for discharge.  ED Discharge Orders         Ordered    naproxen (NAPROSYN) 375 MG tablet  2 times daily     12/16/18 1950           Denym Christenberry, Eather ColasCortni S, PA-C 12/16/18 1952    Melene PlanFloyd, Dan, DO 12/16/18 2051

## 2018-12-16 NOTE — ED Triage Notes (Signed)
Pt states he started having right sided flank pain two days ago. Denies n/v/d. Denies urinary symptoms.

## 2018-12-16 NOTE — Discharge Instructions (Addendum)
You may alternate taking Tylenol and Naproxen as needed for pain control. You may take Naproxen twice daily as directed on your discharge paperwork and you may take  [phone] mg of Tylenol every 6 hours. Do not exceed 4000 mg of Tylenol daily as this can lead to liver damage. Also, make sure to take Naproxen with meals as it can cause an upset stomach. Do not take other NSAIDs while taking Naproxen such as (Aleve, Ibuprofen, Aspirin, Celebrex, etc) and do not take more than the prescribed dose as this can lead to ulcers and bleeding in your GI tract. You may use warm and cold compresses to help with your symptoms.   Please follow up with your primary doctor within the next 7-10 days for re-evaluation and further treatment of your symptoms.   Please return to the ER sooner if you have any new or worsening symptoms.   --------------   Karie Schwalbe guhinduranya gufata Tylenol na Naproxen nkuko bikenewe [place]. Karie Schwalbe gufata Naproxen kabiri kumunsi nkuko byerekanwe kumpapuro zisohora kandi urashobora gufata mg [phone] ya Tylenol buri masaha 6. Ntukarenge 4000 mg ya Tylenol buri munsi kuko ibyo bishobora gutera umwijima. Kandi, menya neza gufata Naproxen hamwe nifunguro kuko rishobora gutera igifu. Ntugafate izindi NSAIDs mugihe ufata Naproxen nka (Aleve, Ibuprofen, Aspirin, Celebrex, nibindi) kandi ntugafate ibirenze urugero rwateganijwe kuko ibi bishobora gutera ibisebe no kuva amaraso mubice byawe bya GI. Karie Schwalbe gukoresha compresses zishyushye nubukonje kugirango zifashe ibimenyetso byawe.  [place] kurikira umuganga wawe wibanze muminsi 7-10 iri imbere kugirango wongere usuzume kandi ukomeze kuvura ibimenyetso byawe.  [place] subira muri ER vuba niba ufite ibimenyetso bishya cyangwa bibi.

## 2018-12-16 NOTE — ED Notes (Signed)
Patient transported to X-ray 

## 2019-05-03 IMAGING — DX DG CHEST 2V
2 series · 2 of 2 positions shown · non-contrast
Comparison: 04/29/2016

CLINICAL DATA: Per pt: sick for three weeks, cough, fever now is
102.5, chest pain. Non-smoker. No history of respiratory or cardiac
disease. No HBP or diabetes.

EXAM:
CHEST  2 VIEW

[chest pa]
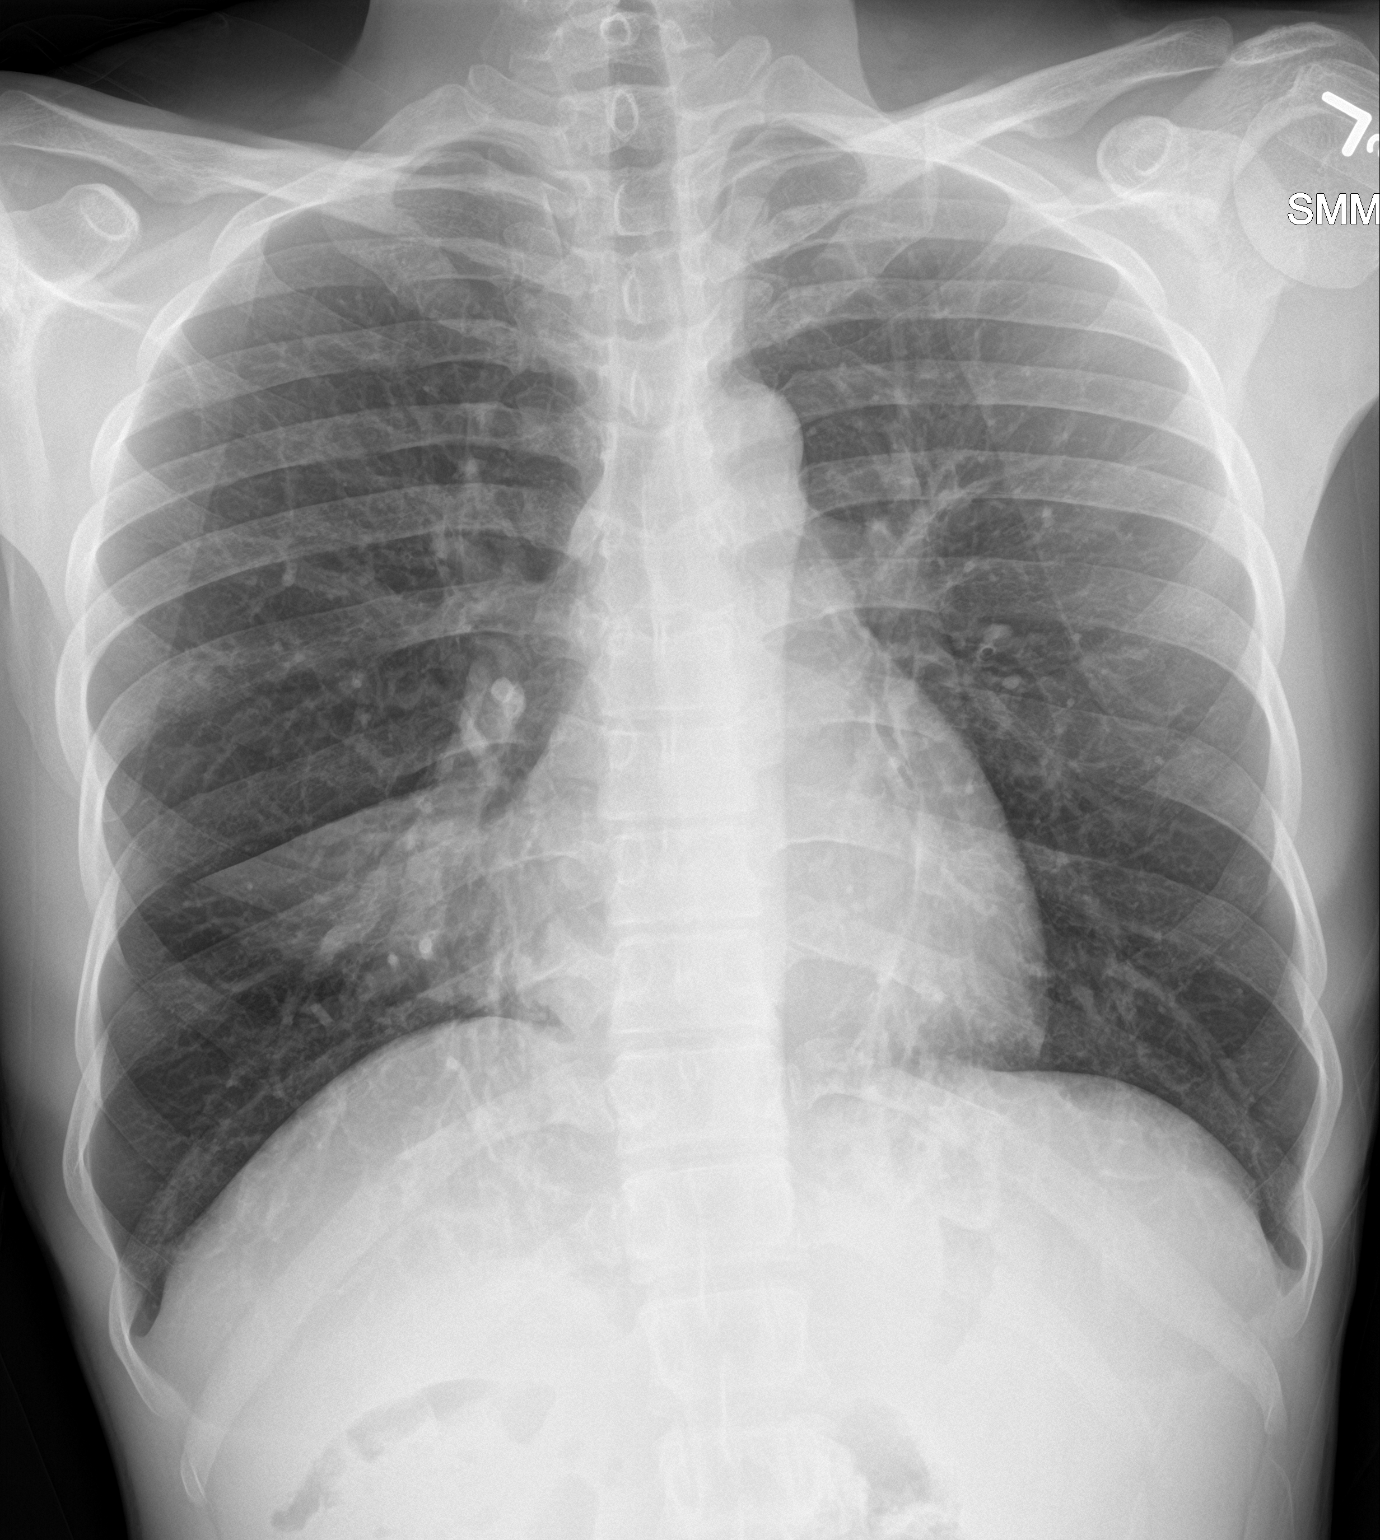

[chest lat]
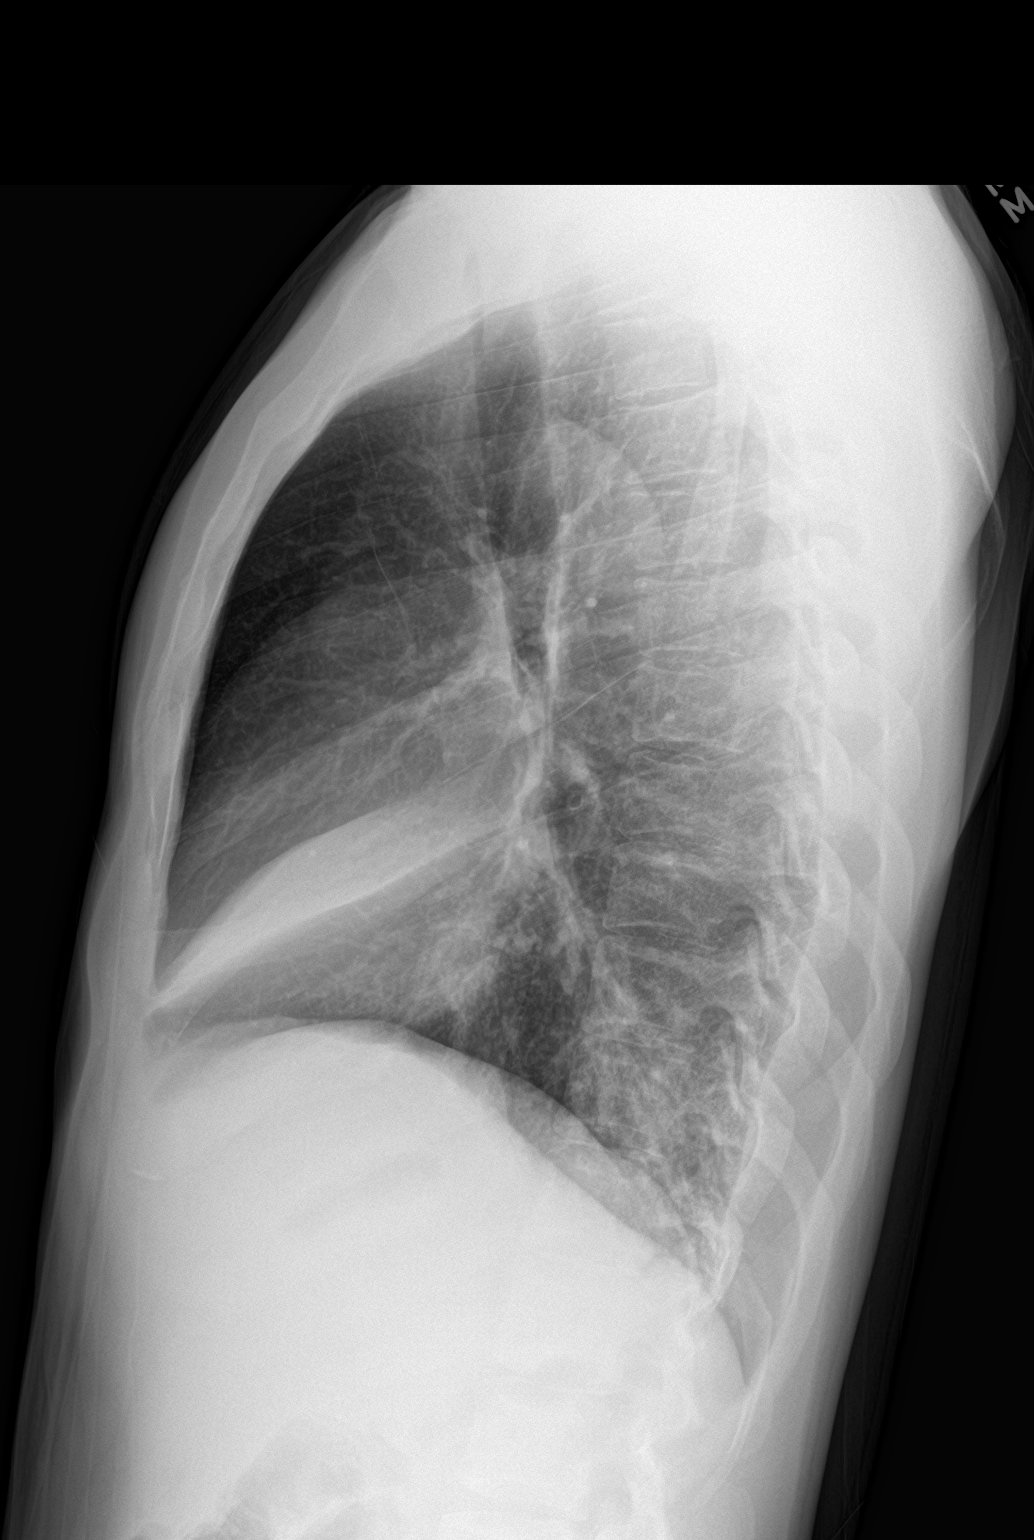

[2 of 2 positions shown; findings below may reference images not displayed]

FINDINGS: Midline trachea. Normal heart size and mediastinal contours. No
pleural effusion or pneumothorax. Right middle lobe and probable
right lower lobe airspace disease. Clear left lung.
IMPRESSION: Right middle lobe and probable right lower lobe airspace disease,
most consistent with pneumonia.

## 2019-08-30 ENCOUNTER — Ambulatory Visit (HOSPITAL_COMMUNITY)
Admission: EM | Admit: 2019-08-30 | Discharge: 2019-08-30 | Disposition: A | Discharge status: Home / Self Care | Payer: Medicaid Other | Attending: Family Medicine | Admitting: Family Medicine

## 2019-08-30 ENCOUNTER — Encounter (HOSPITAL_COMMUNITY): Payer: Self-pay

## 2019-08-30 ENCOUNTER — Other Ambulatory Visit: Payer: Self-pay

## 2019-08-30 DIAGNOSIS — R682 Dry mouth, unspecified: Secondary | ICD-10-CM | POA: Diagnosis not present

## 2019-08-30 DIAGNOSIS — Z20822 Contact with and (suspected) exposure to covid-19: Secondary | ICD-10-CM | POA: Insufficient documentation

## 2019-08-30 DIAGNOSIS — R519 Headache, unspecified: Secondary | ICD-10-CM | POA: Diagnosis not present

## 2019-08-30 DIAGNOSIS — K130 Diseases of lips: Secondary | ICD-10-CM

## 2019-08-30 DIAGNOSIS — R5383 Other fatigue: Secondary | ICD-10-CM | POA: Insufficient documentation

## 2019-08-30 DIAGNOSIS — J3489 Other specified disorders of nose and nasal sinuses: Secondary | ICD-10-CM

## 2019-08-30 MED ORDER — MAGIC MOUTHWASH: 5.0000 mL | Freq: Three times a day (TID) | ORAL | 0 refills | Status: AC | PRN

## 2019-08-30 NOTE — ED Triage Notes (Signed)
Pt presents with nasal drainage, dry mouth, intermittent headache, and left side flank pain X 1 month.

## 2019-08-30 NOTE — Discharge Instructions (Addendum)
Biotene mouth rinse twice a day to help with dry mouth, may use Mucinex for runny nose and congestion.   Dermatology information provided in this handout.   Go to the Emergency Room for high fever, shortness of breath, severe diarrhea and other concerning symptoms.  Your COVID test is pending.  You should self quarantine until your test result is back and is negative.    Take Tylenol as needed for fever or discomfort.  Rest and keep yourself hydrated.    Go to the emergency department if you develop high fever, shortness of breath, severe diarrhea, or other concerning symptoms.

## 2019-08-30 NOTE — ED Provider Notes (Signed)
[facility]    CSN: [id number] Arrival date & time: 08/30/19  0825      History   Chief Complaint Chief Complaint  Patient presents with  . Nasal Drainage  . Dry Mouth  . Headache  . Flank Pain    HPI Kyle Morris is a 29 y.o. male.   Reports dry mouth and lips for the last 2 months.  Reports that he wears a mask at work. Reports runny nose, headache, fatigue, loss of appetite, change in taste since yesterday.  Denies fever, abdominal pain, nausea, vomiting, diarrhea, rash, other symptoms.  Denies sick contacts.  ROS per HPI  The history is provided by the patient.    History reviewed. No pertinent past medical history.  There are no problems to display for this patient.   History reviewed. No pertinent surgical history.     Home Medications    Prior to Admission medications   Medication Sig Start Date End Date Taking? Authorizing Provider  diclofenac (VOLTAREN) 75 MG EC tablet Take 1 tablet (75 mg total) by mouth 2 (two) times daily. 07/07/17   Mardella Layman, MD  docusate sodium (COLACE) 100 MG capsule Take 1 capsule (100 mg total) by mouth daily. 05/30/17   Georgetta Haber, NP  ketotifen (ZADITOR) 0.025 % ophthalmic solution Place 1 drop into both eyes 2 (two) times daily. 11/06/17   Georgetta Haber, NP  magic mouthwash SOLN Take 5 mLs by mouth 3 (three) times daily as needed for up to 5 days for mouth pain. 08/30/19 09/04/19  Moshe Cipro, NP  metoCLOPramide (REGLAN) 10 MG tablet Take 1 tablet (10 mg total) by mouth every 6 (six) hours as needed for nausea (nausea/headache). 10/23/18   Charlynne Pander, MD  nitrofurantoin, macrocrystal-monohydrate, (MACROBID) 100 MG capsule Take 1 capsule (100 mg total) by mouth 2 (two) times daily. 10/19/18   Eustace Moore, MD  ranitidine (ZANTAC) 150 MG capsule Take 1 capsule (150 mg total) by mouth daily. 07/07/17   Mardella Layman, MD    Family History Family History  Family history unknown: Yes     Social History Social History   Tobacco Use  . Smoking status: Never Smoker  . Smokeless tobacco: Never Used  Substance Use Topics  . Alcohol use: Yes    Comment: socially  . Drug use: Not on file     Allergies   Patient has no known allergies.   Review of Systems Review of Systems   Physical Exam Triage Vital Signs ED Triage Vitals  Enc Vitals Group     BP      Pulse      Resp      Temp      Temp src      SpO2      Weight      Height      Head Circumference      Peak Flow      Pain Score      Pain Loc      Pain Edu?      Excl. in GC?    No data found.  Updated Vital Signs BP 112/77 (BP Location: Left Arm)   Pulse 86   Temp 99.2 F (37.3 C) (Oral)   Resp 18   SpO2 100%      Physical Exam Vitals and nursing note reviewed.  Constitutional:      General: He is not in acute distress.    Appearance: He is well-developed and  normal weight.  HENT:     Head: Normocephalic and atraumatic.     Right Ear: Tympanic membrane normal.     Left Ear: Tympanic membrane normal.     Nose: Nose normal.     Mouth/Throat:     Mouth: Mucous membranes are moist.  Eyes:     Conjunctiva/sclera: Conjunctivae normal.  Cardiovascular:     Rate and Rhythm: Normal rate and regular rhythm.     Heart sounds: Normal heart sounds. No murmur.  Pulmonary:     Effort: Pulmonary effort is normal. No respiratory distress.     Breath sounds: Normal breath sounds. No stridor. No wheezing, rhonchi or rales.  Abdominal:     Palpations: Abdomen is soft.     Tenderness: There is no abdominal tenderness.  Musculoskeletal:        General: Normal range of motion.     Cervical back: Neck supple.  Skin:    General: Skin is warm and dry.     Capillary Refill: Capillary refill takes less than 2 seconds.  Neurological:     General: No focal deficit present.     Mental Status: He is alert and oriented to person, place, and time.  Psychiatric:        Mood and Affect: Mood normal.         Behavior: Behavior normal.      UC Treatments / Results  Labs (all labs ordered are listed, but only abnormal results are displayed) Labs Reviewed  NOVEL CORONAVIRUS, NAA (HOSP ORDER, SEND-OUT TO REF LAB; TAT 18-24 HRS)    EKG   Radiology No results found.  Procedures Procedures (including critical care time)  Medications Ordered in UC Medications - No data to display  Initial Impression / Assessment and Plan / UC Course  I have reviewed the triage vital signs and the nursing notes.  Pertinent labs & imaging results that were available during my care of the patient were reviewed by me and considered in my medical decision making (see chart for details).     Dry mouth and lips likely due to mask use daily in the cold weather.  Advised to use Biotene, Magic mouthwash sent at patient request.  Continue to use lip balm, Vaseline as needed to lips.  Instructed to follow-up with dermatology if this is not resolving.  Covid test obtained and pending, will inform patients of results when they are back.  Instructed to quarantine until results are back and negative.  Instructed to go to the ER for shortness of breath, fever, severe diarrhea, or other concerning symptoms. Final Clinical Impressions(s) / UC Diagnoses   Final diagnoses:  Rhinorrhea  Dry mouth  Cheilitis  Fatigue, unspecified type  Nonintractable headache, unspecified chronicity pattern, unspecified headache type     Discharge Instructions     Biotene mouth rinse twice a day to help with dry mouth, may use Mucinex for runny nose and congestion.   Dermatology information provided in this handout.   Go to the Emergency Room for high fever, shortness of breath, severe diarrhea and other concerning symptoms.  Your COVID test is pending.  You should self quarantine until your test result is back and is negative.    Take Tylenol as needed for fever or discomfort.  Rest and keep yourself hydrated.    Go to the  emergency department if you develop high fever, shortness of breath, severe diarrhea, or other concerning symptoms.          ED  Prescriptions    Medication Sig Dispense Auth. Provider   magic mouthwash SOLN Take 5 mLs by mouth 3 (three) times daily as needed for up to 5 days for mouth pain. 75 mL Moshe Cipro, NP     I have reviewed the PDMP during this encounter.   Moshe Cipro, NP 08/30/19 [phone]

## 2019-09-01 LAB — NOVEL CORONAVIRUS, NAA (HOSP ORDER, SEND-OUT TO REF LAB; TAT 18-24 HRS): SARS-CoV-2, NAA: NOT DETECTED

## 2020-03-03 ENCOUNTER — Encounter (HOSPITAL_COMMUNITY): Payer: Self-pay

## 2020-03-03 ENCOUNTER — Ambulatory Visit (HOSPITAL_COMMUNITY)
Admission: EM | Admit: 2020-03-03 | Discharge: 2020-03-03 | Disposition: A | Discharge status: Home / Self Care | Payer: No Typology Code available for payment source | Attending: Emergency Medicine | Admitting: Emergency Medicine

## 2020-03-03 ENCOUNTER — Other Ambulatory Visit: Payer: Self-pay

## 2020-03-03 DIAGNOSIS — M545 Low back pain, unspecified: Secondary | ICD-10-CM

## 2020-03-03 DIAGNOSIS — M546 Pain in thoracic spine: Secondary | ICD-10-CM

## 2020-03-03 MED ORDER — METHOCARBAMOL 500 MG PO TABS: 500.0000 mg | ORAL_TABLET | Freq: Two times a day (BID) | ORAL | 0 refills | Status: DC | PRN

## 2020-03-03 MED ORDER — IBUPROFEN 600 MG PO TABS: 600.0000 mg | ORAL_TABLET | Freq: Four times a day (QID) | ORAL | 0 refills | Status: DC | PRN

## 2020-03-03 NOTE — Discharge Instructions (Signed)
Take the prescribed ibuprofen as needed for your pain.  Take the muscle relaxer as needed for muscle spasm; Do not drive, operate machinery, or drink alcohol with this medication as it may make you drowsy.    Follow up with your primary care provider or an orthopedist if your pain is not improving.       

## 2020-03-03 NOTE — ED Triage Notes (Signed)
Pt presents with back pain after a fall on Monday.

## 2020-03-03 NOTE — ED Provider Notes (Signed)
[facility]    CSN: [id number] Arrival date & time: 03/03/20  1345      History   Chief Complaint Chief Complaint  Patient presents with  . Back Pain  . Fall    HPI Kyle Morris is a 29 y.o. male.   Patient presents with bilateral lower back pain and bilateral thoracic back pain after falling at work on 02/28/2020.  He states he was attempting to lift a box that was too heavy and he went down to the ground.  He denies numbness, weakness, paresthesias, loss of control of his bowel/bladder, chest pain, shortness of breath, abdominal pain, or other symptoms.  He states he was given Tylenol at work on the day of the injury but has not taken any medication since.  The history is provided by the patient.    History reviewed. No pertinent past medical history.  There are no problems to display for this patient.   History reviewed. No pertinent surgical history.     Home Medications    Prior to Admission medications   Medication Sig Start Date End Date Taking? Authorizing Provider  diclofenac (VOLTAREN) 75 MG EC tablet Take 1 tablet (75 mg total) by mouth 2 (two) times daily. 07/07/17   Mardella Layman, MD  docusate sodium (COLACE) 100 MG capsule Take 1 capsule (100 mg total) by mouth daily. 05/30/17   Georgetta Haber, NP  ibuprofen (ADVIL) 600 MG tablet Take 1 tablet (600 mg total) by mouth every 6 (six) hours as needed. 03/03/20   Mickie Bail, NP  ketotifen (ZADITOR) 0.025 % ophthalmic solution Place 1 drop into both eyes 2 (two) times daily. 11/06/17   Georgetta Haber, NP  methocarbamol (ROBAXIN) 500 MG tablet Take 1 tablet (500 mg total) by mouth 2 (two) times daily as needed for muscle spasms. 03/03/20   Mickie Bail, NP  metoCLOPramide (REGLAN) 10 MG tablet Take 1 tablet (10 mg total) by mouth every 6 (six) hours as needed for nausea (nausea/headache). 10/23/18   Charlynne Pander, MD  nitrofurantoin, macrocrystal-monohydrate, (MACROBID) 100 MG capsule Take 1  capsule (100 mg total) by mouth 2 (two) times daily. 10/19/18   Eustace Moore, MD  ranitidine (ZANTAC) 150 MG capsule Take 1 capsule (150 mg total) by mouth daily. 07/07/17   Mardella Layman, MD    Family History Family History  Family history unknown: Yes    Social History Social History   Tobacco Use  . Smoking status: Never Smoker  . Smokeless tobacco: Never Used  Vaping Use  . Vaping Use: Never used  Substance Use Topics  . Alcohol use: Yes    Comment: socially  . Drug use: Not on file     Allergies   Patient has no known allergies.   Review of Systems Review of Systems  Constitutional: Negative for chills and fever.  HENT: Negative for ear pain and sore throat.   Eyes: Negative for pain and visual disturbance.  Respiratory: Negative for cough and shortness of breath.   Cardiovascular: Negative for chest pain and palpitations.  Gastrointestinal: Negative for abdominal pain and vomiting.  Genitourinary: Negative for dysuria and hematuria.  Musculoskeletal: Positive for back pain. Negative for arthralgias.  Skin: Negative for color change and rash.  Neurological: Negative for seizures, syncope, weakness and numbness.  All other systems reviewed and are negative.    Physical Exam Triage Vital Signs ED Triage Vitals  Enc Vitals Group     BP  Pulse      Resp      Temp      Temp src      SpO2      Weight      Height      Head Circumference      Peak Flow      Pain Score      Pain Loc      Pain Edu?      Excl. in GC?    No data found.  Updated Vital Signs BP 120/79 (BP Location: Left Arm)   Pulse 62   Temp 98.3 F (36.8 C) (Oral)   Resp 18   SpO2 100%   Visual Acuity Right Eye Distance:   Left Eye Distance:   Bilateral Distance:    Right Eye Near:   Left Eye Near:    Bilateral Near:     Physical Exam Vitals and nursing note reviewed.  Constitutional:      General: He is not in acute distress.    Appearance: He is well-developed.  He is not ill-appearing.  HENT:     Head: Normocephalic and atraumatic.     Mouth/Throat:     Mouth: Mucous membranes are moist.  Eyes:     Conjunctiva/sclera: Conjunctivae normal.  Cardiovascular:     Rate and Rhythm: Normal rate and regular rhythm.     Heart sounds: Normal heart sounds. No murmur heard.   Pulmonary:     Effort: Pulmonary effort is normal. No respiratory distress.     Breath sounds: Normal breath sounds.  Abdominal:     Palpations: Abdomen is soft.     Tenderness: There is no abdominal tenderness. There is no right CVA tenderness, left CVA tenderness, guarding or rebound.  Musculoskeletal:        General: Tenderness present. No swelling, deformity or signs of injury. Normal range of motion.     Cervical back: Neck supple.     Comments: Mild tenderness to palpation of all areas of middle and lower back.  Skin:    General: Skin is warm and dry.     Findings: No bruising, erythema, lesion or rash.  Neurological:     General: No focal deficit present.     Mental Status: He is alert and oriented to person, place, and time.     Sensory: No sensory deficit.     Motor: No weakness.     Coordination: Coordination normal.     Gait: Gait normal.  Psychiatric:        Mood and Affect: Mood normal.        Behavior: Behavior normal.      UC Treatments / Results  Labs (all labs ordered are listed, but only abnormal results are displayed) Labs Reviewed - No data to display  EKG   Radiology No results found.  Procedures Procedures (including critical care time)  Medications Ordered in UC Medications - No data to display  Initial Impression / Assessment and Plan / UC Course  I have reviewed the triage vital signs and the nursing notes.  Pertinent labs & imaging results that were available during my care of the patient were reviewed by me and considered in my medical decision making (see chart for details).   Acute low back pain, acute thoracic back pain.   Patient declined x-rays today.  Treating with ibuprofen and Robaxin.  Precautions for drowsiness with Robaxin discussed with patient.  Instructed him to follow-up with his PCP or an orthopedist  if his symptoms are not improving.  Patient agrees to plan of care.   Final Clinical Impressions(s) / UC Diagnoses   Final diagnoses:  Acute bilateral low back pain without sciatica  Acute bilateral thoracic back pain     Discharge Instructions     Take the prescribed ibuprofen as needed for your pain.  Take the muscle relaxer as needed for muscle spasm; Do not drive, operate machinery, or drink alcohol with this medication as it may make you drowsy.    Follow up with your primary care provider or an orthopedist if your pain is not improving.        ED Prescriptions    Medication Sig Dispense Auth. Provider   ibuprofen (ADVIL) 600 MG tablet Take 1 tablet (600 mg total) by mouth every 6 (six) hours as needed. 30 tablet Mickie Bail, NP   methocarbamol (ROBAXIN) 500 MG tablet Take 1 tablet (500 mg total) by mouth 2 (two) times daily as needed for muscle spasms. 20 tablet Mickie Bail, NP     I have reviewed the PDMP during this encounter.   Mickie Bail, NP 03/03/20 [phone]

## 2020-06-16 ENCOUNTER — Encounter (HOSPITAL_COMMUNITY): Payer: Self-pay

## 2020-06-16 ENCOUNTER — Ambulatory Visit (HOSPITAL_COMMUNITY)
Admission: EM | Admit: 2020-06-16 | Discharge: 2020-06-16 | Disposition: A | Discharge status: Home / Self Care | Payer: No Typology Code available for payment source | Attending: Emergency Medicine | Admitting: Emergency Medicine

## 2020-06-16 ENCOUNTER — Other Ambulatory Visit: Payer: Self-pay

## 2020-06-16 ENCOUNTER — Ambulatory Visit (INDEPENDENT_AMBULATORY_CARE_PROVIDER_SITE_OTHER): Payer: No Typology Code available for payment source

## 2020-06-16 DIAGNOSIS — M545 Low back pain, unspecified: Secondary | ICD-10-CM

## 2020-06-16 DIAGNOSIS — S39012A Strain of muscle, fascia and tendon of lower back, initial encounter: Secondary | ICD-10-CM | POA: Diagnosis not present

## 2020-06-16 MED ORDER — TIZANIDINE HCL 4 MG PO TABS: 4.0000 mg | ORAL_TABLET | Freq: Four times a day (QID) | ORAL | 0 refills | Status: DC | PRN

## 2020-06-16 MED ORDER — IBUPROFEN 600 MG PO TABS: 600.0000 mg | ORAL_TABLET | Freq: Four times a day (QID) | ORAL | 0 refills | Status: DC | PRN

## 2020-06-16 NOTE — ED Triage Notes (Signed)
Pt presents with lower back pain x 1 week. States he can not bend over or walk.  Pt had not take any medication for the pain.

## 2020-06-16 NOTE — ED Provider Notes (Addendum)
[facility]    CSN: [id number] Arrival date & time: 06/16/20  1114      History   Chief Complaint Chief Complaint  Patient presents with  . Back Pain    HPI Kyle Morris is a 29 y.o. male.   HPI   29 year old male here for evaluation of low back pain.  Patient is from [place] and speaks [place]. Interpreter [phone] used to complete this intake. Patient reports that his pain started 1 week ago while at work. He states that he does not member any particular trauma or lifting anything heavy. He does report that he did lift an object and felt a slight twinge that resolved and then the next day he had pain in his low back. Patient states that he is have low back pain for a while but this is the worst it has ever been. Patient denies numbness, tingling, or weakness. Patient states that he has not take anything for his pain he can find a thing that will help his pain. Movement worsens his pain.  History reviewed. No pertinent past medical history.  There are no problems to display for this patient.   History reviewed. No pertinent surgical history.     Home Medications    Prior to Admission medications   Medication Sig Start Date End Date Taking? Authorizing Provider  ibuprofen (ADVIL) 600 MG tablet Take 1 tablet (600 mg total) by mouth every 6 (six) hours as needed. 06/16/20   Becky Augusta, NP  ketotifen (ZADITOR) 0.025 % ophthalmic solution Place 1 drop into both eyes 2 (two) times daily. 11/06/17   Georgetta Haber, NP  metoCLOPramide (REGLAN) 10 MG tablet Take 1 tablet (10 mg total) by mouth every 6 (six) hours as needed for nausea (nausea/headache). 10/23/18   Charlynne Pander, MD  nitrofurantoin, macrocrystal-monohydrate, (MACROBID) 100 MG capsule Take 1 capsule (100 mg total) by mouth 2 (two) times daily. 10/19/18   Eustace Moore, MD  ranitidine (ZANTAC) 150 MG capsule Take 1 capsule (150 mg total) by mouth daily. 07/07/17   Mardella Layman, MD   tiZANidine (ZANAFLEX) 4 MG tablet Take 1 tablet (4 mg total) by mouth every 6 (six) hours as needed for muscle spasms. 06/16/20   Becky Augusta, NP    Family History Family History  Family history unknown: Yes    Social History Social History   Tobacco Use  . Smoking status: Never Smoker  . Smokeless tobacco: Never Used  Vaping Use  . Vaping Use: Never used  Substance Use Topics  . Alcohol use: Yes    Comment: socially  . Drug use: Never     Allergies   Patient has no known allergies.   Review of Systems Review of Systems  Constitutional: Negative for activity change, appetite change and fever.  Musculoskeletal: Positive for back pain. Negative for arthralgias and myalgias.  Skin: Negative for color change.  Neurological: Negative for weakness and numbness.  Hematological: Negative.   Psychiatric/Behavioral: Negative.      Physical Exam Triage Vital Signs ED Triage Vitals [06/16/20 1152]  Enc Vitals Group     BP 130/85     Pulse Rate 73     Resp 17     Temp 98.6 F (37 C)     Temp Source Oral     SpO2 97 %     Weight      Height      Head Circumference      Peak Flow  Pain Score      Pain Loc      Pain Edu?      Excl. in GC?    No data found.  Updated Vital Signs BP 130/85 (BP Location: Left Arm)   Pulse 73   Temp 98.6 F (37 C) (Oral)   Resp 17   SpO2 97%   Visual Acuity Right Eye Distance:   Left Eye Distance:   Bilateral Distance:    Right Eye Near:   Left Eye Near:    Bilateral Near:     Physical Exam Vitals and nursing note reviewed.  Constitutional:      General: He is not in acute distress.    Appearance: Normal appearance. He is normal weight. He is not toxic-appearing.  HENT:     Head: Normocephalic and atraumatic.  Eyes:     General: No scleral icterus.    Extraocular Movements: Extraocular movements intact.     Conjunctiva/sclera: Conjunctivae normal.     Pupils: Pupils are equal, round, and reactive to light.   Cardiovascular:     Rate and Rhythm: Normal rate and regular rhythm.     Pulses: Normal pulses.     Heart sounds: Normal heart sounds. No murmur heard. No gallop.   Pulmonary:     Effort: Pulmonary effort is normal.     Breath sounds: Normal breath sounds. No wheezing, rhonchi or rales.  Musculoskeletal:        General: Tenderness present. No swelling, deformity or signs of injury. Normal range of motion.  Skin:    General: Skin is warm and dry.     Capillary Refill: Capillary refill takes less than 2 seconds.     Findings: No bruising or erythema.  Neurological:     General: No focal deficit present.     Mental Status: He is alert and oriented to person, place, and time.  Psychiatric:        Mood and Affect: Mood normal.        Behavior: Behavior normal.        Thought Content: Thought content normal.        Judgment: Judgment normal.      UC Treatments / Results  Labs (all labs ordered are listed, but only abnormal results are displayed) Labs Reviewed - No data to display  EKG   Radiology DG Lumbar Spine 2-3 Views  Result Date: 06/16/2020 CLINICAL DATA:  Low back pain for 1 week. L3 spinous process tenderness EXAM: LUMBAR SPINE - 2-3 VIEW COMPARISON:  None. FINDINGS: There is no evidence of lumbar spine fracture. Spinous processes and transverse processes appear intact. Alignment is normal. Intervertebral disc spaces are maintained. No significant facet joint arthropathy is evident. IMPRESSION: Negative. Electronically Signed   By: Duanne Guess D.O.   On: 06/16/2020 13:12    Procedures Procedures (including critical care time)  Medications Ordered in UC Medications - No data to display  Initial Impression / Assessment and Plan / UC Course  I have reviewed the triage vital signs and the nursing notes.  Pertinent labs & imaging results that were available during my care of the patient were reviewed by me and considered in my medical decision making (see chart  for details).   Patient is here for evaluation of low back pain this been going on for the past week. Patient states that it is in the center of his low back. On exam patient has tenderness to the spinous process of L3. There is no  ecchymosis, edema, or erythema. Patient has been evaluated 3 times prior since 2019 for low back pain. Lower extremity DTRs are 2+, strength is 5/5 bilaterally. There are no neurologic deficits appreciated. Patient is insistent upon x-rays of his low back despite no injury or trauma. Discussed this with patient through the interpreter. Patient is still insistent however. Will order lumbar spine films. Suspect patient has lumbar strain, plan to treat with NSAIDs and muscle relaxers.  Lumbar spine films independently evaluated by me.  There are no fractures identified on the images.  There is a loss of lumbar lordosis indicating spasm, no displaced abnormalities appreciable.  Radiology overread is pending.  Radiology over read is consistent with my exam and interpretation.  Final Clinical Impressions(s) / UC Diagnoses   Final diagnoses:  Strain of lumbar region, initial encounter     Discharge Instructions     Fata Ibuprofen buri masaha 6 hamwe nibiryo muminsi 2 iri imbere hanyuma bikenewe [place].  Fata Tizanidine buri masaha 6 muminsi 2 iri imbere hanyuma nkuko bikenewe [place].  Kurikiza imyitozo yinyuma wahawe mugihe cyo [place].  Niba ibimenyetso byawe bikomeje, cyangwa bikabije, jya kuri [facility] kugirango usuzume hamwe nubuvuzi [place].  Discharge reviewed with patient using and interpreter. Written instructions obtained using Google translate.   Take the Ibuprofen every 6 hours with food for the next 2 days and then as needed for pain.  Take the Tizanidine every 6 hours for the next 2 days and then as needed for muscle spasm.  Follow the back exercises given to you at discharge.  If your symptoms continue, or worsen, go to [facility] for  evaluation and possible physical therapy.    ED Prescriptions    Medication Sig Dispense Auth. Provider   ibuprofen (ADVIL) 600 MG tablet Take 1 tablet (600 mg total) by mouth every 6 (six) hours as needed. 30 tablet Becky Augusta, NP   tiZANidine (ZANAFLEX) 4 MG tablet Take 1 tablet (4 mg total) by mouth every 6 (six) hours as needed for muscle spasms. 30 tablet Becky Augusta, NP     I have reviewed the PDMP during this encounter.   Becky Augusta, NP 06/16/20 1316    Becky Augusta, NP 06/16/20 1341

## 2020-06-16 NOTE — Discharge Instructions (Addendum)
Fata Ibuprofen buri masaha 6 hamwe nibiryo muminsi 2 iri imbere hanyuma bikenewe [place].  Fata Tizanidine buri masaha 6 muminsi 2 iri imbere hanyuma nkuko bikenewe [place].  Kurikiza imyitozo yinyuma wahawe mugihe cyo [place].  Niba ibimenyetso byawe bikomeje, cyangwa bikabije, jya kuri [facility] kugirango usuzume hamwe nubuvuzi [place].  Discharge reviewed with patient using and interpreter. Written instructions obtained using Google translate.   Take the Ibuprofen every 6 hours with food for the next 2 days and then as needed for pain.  Take the Tizanidine every 6 hours for the next 2 days and then as needed for muscle spasm.  Follow the back exercises given to you at discharge.  If your symptoms continue, or worsen, go to [facility] for evaluation and possible physical therapy.

## 2020-07-25 ENCOUNTER — Other Ambulatory Visit: Payer: Self-pay

## 2020-07-25 ENCOUNTER — Encounter: Payer: Self-pay | Admitting: Internal Medicine

## 2020-07-25 ENCOUNTER — Ambulatory Visit: Payer: No Typology Code available for payment source | Attending: Internal Medicine | Admitting: Internal Medicine

## 2020-07-25 DIAGNOSIS — M545 Low back pain, unspecified: Secondary | ICD-10-CM | POA: Diagnosis not present

## 2020-07-25 DIAGNOSIS — Z2881 Immunization not carried out due to patient having had the disease: Secondary | ICD-10-CM

## 2020-07-25 MED ORDER — IBUPROFEN 600 MG PO TABS: 600.0000 mg | ORAL_TABLET | Freq: Three times a day (TID) | ORAL | 0 refills | Status: DC | PRN

## 2020-07-25 MED ORDER — TIZANIDINE HCL 4 MG PO TABS: 4.0000 mg | ORAL_TABLET | Freq: Two times a day (BID) | ORAL | 0 refills | Status: DC | PRN

## 2020-07-25 NOTE — Progress Notes (Signed)
Virtual Visit via Telephone Note  I connected with Kyle Morris on 07/25/20 at 3:07 pm by telephone and verified that I am speaking with the correct person using two identifiers.  Location: Patient: home Provider: office  The pt, myself and Marisue Humble [phone]) with [employer] interpreters participated in this visit I discussed the limitations, risks, security and privacy concerns of performing an evaluation and management service by telephone and the availability of in person appointments. I also discussed with the patient that there may be a patient responsible charge related to this service. The patient expressed understanding and agreed to proceed.   History of Present Illness: This is a new pt visit and for f/u ER visit for lower back pain. Seen in ER 5 wks ago with c/o lower back that had started 1 wk prior.  Reported lifting an object the day before and felt a tinge in his back.  Woke the next day with pain worse. Reported intermittent back pain but this was worse. No radicular symptoms.  X-ray lumbar spine okay.  Assessed to have lumbar strain.  Sent home on Ibuprofen and Zanaflex PRN.    Since ER visit, patient states that his back is much better.  Sometimes however pain flares up where it hurts to stand.  He denies any radiation of pain down the legs or numbness or tingling in the legs.  No loss of bowel or bladder function.  He is out of ibuprofen and Zanaflex.  He did find the medicines helpful.  HM: He is not had the flu shot or COVID-19 vaccine.  He was a bit hesitant to get the flu shot because he states a friend had the shot and developed a very bad headache. Observations/Objective: No direct observation done as this was a telephone encounter. I did look at the x-ray report of the lumbar spine.  Assessment and Plan: 1. Acute bilateral low back pain without sciatica Patient reports improvement.  He gets intermittent flareups.  Advised against heavy lifting.  I have sent  refill on ibuprofen and Zanaflex for him to keep on hand to use as needed.  2. Vaccination hesitancy by patient Strongly encouraged him to get both the flu vaccine and COVID-19 vaccines.  Advised that he can get them both at [facility] pharmacy where he picks up his medication.  He is agreeable to getting the vaccines.  I will submit his name to our vaccine clinic for the COVID-19 vaccine.   Follow Up Instructions: In 4-8 wks in person   I discussed the assessment and treatment plan with the patient. The patient was provided an opportunity to ask questions and all were answered. The patient agreed with the plan and demonstrated an understanding of the instructions.   The patient was advised to call back or seek an in-person evaluation if the symptoms worsen or if the condition fails to improve as anticipated.  I provided 19  minutes of non-face-to-face time during this encounter.   Jonah Blue, MD

## 2021-01-29 ENCOUNTER — Encounter: Payer: No Typology Code available for payment source | Admitting: Internal Medicine

## 2021-02-02 ENCOUNTER — Encounter: Payer: Medicaid Other | Admitting: Internal Medicine

## 2021-02-16 ENCOUNTER — Encounter: Payer: Medicaid Other | Admitting: Internal Medicine

## 2021-04-03 ENCOUNTER — Other Ambulatory Visit: Payer: Self-pay

## 2021-04-03 ENCOUNTER — Ambulatory Visit: Payer: Commercial Managed Care - PPO | Attending: Internal Medicine | Admitting: Internal Medicine

## 2021-04-03 ENCOUNTER — Encounter: Payer: Self-pay | Admitting: Internal Medicine

## 2021-04-03 VITALS — BP 122/84 | HR 62 | Resp 16 | Ht 67.5 in | Wt 144.6 lb

## 2021-04-03 DIAGNOSIS — Z0001 Encounter for general adult medical examination with abnormal findings: Secondary | ICD-10-CM | POA: Diagnosis not present

## 2021-04-03 DIAGNOSIS — Z1159 Encounter for screening for other viral diseases: Secondary | ICD-10-CM

## 2021-04-03 DIAGNOSIS — Z0000 Encounter for general adult medical examination without abnormal findings: Secondary | ICD-10-CM

## 2021-04-03 DIAGNOSIS — Z23 Encounter for immunization: Secondary | ICD-10-CM | POA: Diagnosis not present

## 2021-04-03 DIAGNOSIS — G43009 Migraine without aura, not intractable, without status migrainosus: Secondary | ICD-10-CM | POA: Diagnosis not present

## 2021-04-03 DIAGNOSIS — M545 Low back pain, unspecified: Secondary | ICD-10-CM | POA: Diagnosis not present

## 2021-04-03 DIAGNOSIS — R030 Elevated blood-pressure reading, without diagnosis of hypertension: Secondary | ICD-10-CM

## 2021-04-03 DIAGNOSIS — G8929 Other chronic pain: Secondary | ICD-10-CM

## 2021-04-03 DIAGNOSIS — Z114 Encounter for screening for human immunodeficiency virus [HIV]: Secondary | ICD-10-CM

## 2021-04-03 MED ORDER — IBUPROFEN 600 MG PO TABS
600.0000 mg | ORAL_TABLET | Freq: Three times a day (TID) | ORAL | 1 refills | Status: DC | PRN
  Filled 2021-04-03: qty 60, 20d supply, fill #0

## 2021-04-03 MED ORDER — TOPIRAMATE 25 MG PO TABS
25.0000 mg | ORAL_TABLET | Freq: Two times a day (BID) | ORAL | 1 refills | Status: DC
  Filled 2021-04-03: qty 30, 15d supply, fill #0

## 2021-04-03 NOTE — Progress Notes (Signed)
Patient ID: Kyle Morris, male    DOB: May 15, 1991  MRN: [id number]  CC: Annual Exam, Back Pain, Headache, and Fatigue   Subjective: Kyle Morris is a 29 y.o. male who presents for physical.  Patient did have an in person interpreter with him but by the time he got to him the interpreter had to leave as he had another client to interpret for.  We subsequently used [employer] His concerns today include:   Main concerns today are headaches and ongoing lower back pain.  On last visit with him in January.  Patient complains of lower back pain that started 6 weeks prior after lifting an object.  The pain was intermittent without radicular symptoms.  He was seen in the emergency room 5 weeks before for the same and had x-ray of the lumbar spine that came back okay.  He was prescribed ibuprofen and Zanaflex from the ER.  Given that his symptoms are improved, at that time I recommend that he continue to use the ibuprofen and Zanaflex as needed and to avoid heavy lifting. Since then, patient reports that the pain across his lower back has been constant for the past 5 months.  He rates the pain as 5-01/2009.  Pain does not radiate down the legs.  No associated numbness.  Worse with sitting for prolonged periods of time and with flexion of the trunk like when he is bending over.  No incontinence of bowel or bladder.  Pain is better when he takes the medication "from the hosp." he does not recall the name of the medication but I assume it is either ibuprofen or Zanaflex.  Also complains of headaches x2 months.  Headache is intermittent and he associates it with the back pain.  States that the headache comes on only when he has strong back pain. -Located all over the head and associated with some dizziness. -Can last an entire day. No associated N/V.  Endorses photophobia. -Takes Tylenol 2 pills most days.  Gives her some relief for 3 to 4 hours. -Nothing makes the headaches worse  other than they come on when he has back pain. -No previous history of headaches.   Current Outpatient Medications on File Prior to Visit  Medication Sig Dispense Refill   tiZANidine (ZANAFLEX) 4 MG tablet Take 1 tablet (4 mg total) by mouth 2 (two) times daily as needed for muscle spasms. 30 tablet 0   No current facility-administered medications on file prior to visit.    No Known Allergies  Social History   Socioeconomic History   Marital status: Married    Spouse name: Not on file   Number of children: Not on file   Years of education: Not on file   Highest education level: Not on file  Occupational History   Not on file  Tobacco Use   Smoking status: Never   Smokeless tobacco: Never  Vaping Use   Vaping Use: Never used  Substance and Sexual Activity   Alcohol use: Yes    Comment: socially   Drug use: Never   Sexual activity: Not on file  Other Topics Concern   Not on file  Social History Narrative   Not on file   Social Determinants of Health   Financial Resource Strain: Not on file  Food Insecurity: Not on file  Transportation Needs: Not on file  Physical Activity: Not on file  Stress: Not on file  Social Connections: Not on file  Intimate Partner Violence: Not on  file    Family History  Family history unknown: Yes     ROS: Review of Systems  HENT:  Negative for sore throat.        He notices some foamy stuff on his tongue in the mornings when he wakes up   PHYSICAL EXAM: BP 129/87   Pulse 62   Resp 16   Ht 5' 7.5" (1.715 m)   Wt 144 lb 9.6 oz (65.6 kg)   SpO2 100%   BMI 22.31 kg/m   Physical Exam Repeat BP 122/84  General appearance - alert, well appearing, young African male and in no distress Mental status - normal mood, behavior, speech, dress, motor activity, and thought processes Eyes - pupils equal and reactive, extraocular eye movements intact Ears - bilateral TM's and external ear canals normal Nose - normal and patent, no  erythema, discharge or polyps Mouth - mucous membranes moist, pharynx normal without lesions Neck - supple, no significant adenopathy, no thyroid enlargement Lymphatics -no cervical or axillary lymphadenopathy Chest - clear to auscultation, no wheezes, rales or rhonchi, symmetric air entry Heart - normal rate, regular rhythm, normal S1, S2, no murmurs, rubs, clicks or gallops MSK-mild tenderness on palpation of the upper to lower lumbar spine and surrounding paraspinal muscles.  He is able to elevate both legs to 90 degrees but reports pain in the lower back in doing so. Neurological - cranial nerves II through XII intact, motor and sensory grossly normal bilaterally, normal muscle tone, no tremors, strength 5/5.  Gait is normal Extremities - peripheral pulses normal, no pedal edema, no clubbing or cyanosis  CMP Latest Ref Rng & Units 04/03/2021 12/16/2018 10/23/2018  Glucose 70 - 99 mg/dL 83 98 87  BUN 6 - 20 mg/dL 8 <5(K) 6  Creatinine 0.93 - 1.27 mg/dL 8.18(E) 9.93(Z) 1.69  Sodium 134 - 144 mmol/L 141 138 136  Potassium 3.5 - 5.2 mmol/L 4.4 3.9 5.1  Chloride 96 - 106 mmol/L 103 103 101  CO2 20 - 29 mmol/L 21 27 24   Calcium 8.7 - 10.2 mg/dL 9.1 9.4 8.9  Total Protein 6.0 - 8.5 g/dL 7.2 7.2 7.0  Total Bilirubin 0.0 - 1.2 mg/dL 0.9 ) 6.7(E)  Alkaline Phos 44 - 121 IU/L 59 59 45  AST 0 - 40 IU/L 22 33 36  ALT 0 - 44 IU/L 14 21 16    Lipid Panel     Component Value Date/Time   CHOL 133 04/03/2021 1200   TRIG 61 04/03/2021 1200   HDL 53 04/03/2021 1200   CHOLHDL 2.5 04/03/2021 1200   LDLCALC 67 04/03/2021 1200    CBC    Component Value Date/Time   WBC 2.8 (L) 04/03/2021 1200   WBC 3.4 (L) 12/16/2018 1656   RBC 5.58 04/03/2021 1200   RBC 5.64 12/16/2018 1656   HGB 16.0 04/03/2021 1200   HCT 48.6 04/03/2021 1200   PLT 192 04/03/2021 1200   MCV 87 04/03/2021 1200   MCH 28.7 04/03/2021 1200   MCH 29.1 12/16/2018 1656   MCHC 32.9 04/03/2021 1200   MCHC 33.4 12/16/2018 1656    RDW 12.7 04/03/2021 1200   LYMPHSABS 0.7 10/23/2018 1629   MONOABS 0.3 10/23/2018 1629   EOSABS 0.0 10/23/2018 1629   BASOSABS 0.0 10/23/2018 1629    ASSESSMENT AND PLAN: 1. Annual physical exam - CBC - Comprehensive metabolic panel - Lipid panel  2. Migraine without aura and without status migrainosus, not intractable Patient associates his headache with his back pain  but sounds as though it may be migraines. I recommend ibuprofen for abortive therapy and trial of Topamax for prophylaxis.  Follow-up in several weeks to see how he is doing - ibuprofen (ADVIL) 600 MG tablet; Take 1 tablet (600 mg total) by mouth every 8 (eight) hours as needed.  Dispense: 60 tablet; Refill: 1 - topiramate (TOPAMAX) 25 MG tablet; Take 1 tablet (25 mg total) by mouth 2 (two) times daily.  Dispense: 30 tablet; Refill: 1  3. Chronic bilateral low back pain without sciatica Given that his back pain has been going on for more than 9 months and getting worse, I think we should pursue advanced imaging study even though pain has been nonradicular.  Further management will be based on results.  Refill on ibuprofen to use as needed.  Avoid heavy lifting. - ibuprofen (ADVIL) 600 MG tablet; Take 1 tablet (600 mg total) by mouth every 8 (eight) hours as needed.  Dispense: 60 tablet; Refill: 1 - MR Lumbar Spine Wo Contrast; Future  4. Elevated blood-pressure reading without diagnosis of hypertension DASH diet discussed and encouraged.  Recheck blood pressure on subsequent visit. - CBC - Comprehensive metabolic panel - Lipid panel  5. Need for hepatitis C screening test Patient agreeable to screening. - Hepatitis C Antibody  6. Screening for HIV (human immunodeficiency virus) Patient agreeable to screening. - HIV antibody (with reflex)  7. Need for Tdap vaccination Recommended.  Patient agreeable to receiving vaccine today.  8. Need for immunization against influenza - Flu Vaccine QUAD 34mo+IM (Fluarix,  Fluzone & Alfiuria Quad PF)  Total of 44 minutes spent on this encounter including face-to-face time with the patient discussing symptoms, diagnosis and treatment and entering orders. Patient wanted to discuss the foaminess on his tongue that he notices in the mornings but was told that we would need to pick that up on his next visit due to time restraints.  Patient was given the opportunity to ask questions.  Patient verbalized understanding of the plan and was able to repeat key elements of the plan.  [employer] interpreter used during this encounter. #[id number], [place]  Orders Placed This Encounter  Procedures   MR Lumbar Spine Wo Contrast   Tdap vaccine greater than or equal to 7yo IM   Flu Vaccine QUAD 55mo+IM (Fluarix, Fluzone & Alfiuria Quad PF)   CBC   Comprehensive metabolic panel   Lipid panel   HIV antibody (with reflex)   Hepatitis C Antibody     Requested Prescriptions   Signed Prescriptions Disp Refills   ibuprofen (ADVIL) 600 MG tablet 60 tablet 1    Sig: Take 1 tablet (600 mg total) by mouth every 8 (eight) hours as needed.   topiramate (TOPAMAX) 25 MG tablet 30 tablet 1    Sig: Take 1 tablet (25 mg total) by mouth 2 (two) times daily.    Return in about 6 weeks (around 05/15/2021).  Jonah Blue, MD, FACP

## 2021-04-04 ENCOUNTER — Encounter: Payer: Self-pay | Admitting: Internal Medicine

## 2021-04-04 DIAGNOSIS — G43009 Migraine without aura, not intractable, without status migrainosus: Secondary | ICD-10-CM | POA: Insufficient documentation

## 2021-04-04 DIAGNOSIS — G8929 Other chronic pain: Secondary | ICD-10-CM | POA: Insufficient documentation

## 2021-04-04 LAB — CBC
Hematocrit: 48.6 % (ref 37.5–51.0)
Hemoglobin: 16 g/dL (ref 13.0–17.7)
MCH: 28.7 pg (ref 26.6–33.0)
MCHC: 32.9 g/dL (ref 31.5–35.7)
MCV: 87 fL (ref 79–97)
Platelets: 192 10*3/uL (ref 150–450)
RBC: 5.58 x10E6/uL (ref 4.14–5.80)
RDW: 12.7 % (ref 11.6–15.4)
WBC: 2.8 10*3/uL — ABNORMAL LOW (ref 3.4–10.8)

## 2021-04-04 LAB — COMPREHENSIVE METABOLIC PANEL
ALT: 14 IU/L (ref 0–44)
AST: 22 IU/L (ref 0–40)
Albumin/Globulin Ratio: 2.1 (ref 1.2–2.2)
Albumin: 4.9 g/dL (ref 4.0–5.0)
Alkaline Phosphatase: 59 IU/L (ref 44–121)
BUN/Creatinine Ratio: 14 (ref 9–20)
BUN: 8 mg/dL (ref 6–20)
Bilirubin Total: 0.9 mg/dL (ref 0.0–1.2)
CO2: 21 mmol/L (ref 20–29)
Calcium: 9.1 mg/dL (ref 8.7–10.2)
Chloride: 103 mmol/L (ref 96–106)
Creatinine, Ser: 0.59 mg/dL — ABNORMAL LOW (ref 0.76–1.27)
Globulin, Total: 2.3 g/dL (ref 1.5–4.5)
Glucose: 83 mg/dL (ref 70–99)
Potassium: 4.4 mmol/L (ref 3.5–5.2)
Sodium: 141 mmol/L (ref 134–144)
Total Protein: 7.2 g/dL (ref 6.0–8.5)
eGFR: 132 mL/min/{1.73_m2} (ref >59)

## 2021-04-04 LAB — LIPID PANEL
Chol/HDL Ratio: 2.5 ratio (ref 0.0–5.0)
Cholesterol, Total: 133 mg/dL (ref 100–199)
HDL: 53 mg/dL (ref >39)
LDL Chol Calc (NIH): 67 mg/dL (ref 0–99)
Triglycerides: 61 mg/dL (ref 0–149)
VLDL Cholesterol Cal: 13 mg/dL (ref 5–40)

## 2021-04-04 LAB — HIV ANTIBODY (ROUTINE TESTING W REFLEX): HIV Screen 4th Generation wRfx: NONREACTIVE

## 2021-04-04 LAB — HEPATITIS C ANTIBODY: Hep C Virus Ab: <0.1 s/co ratio (ref 0.0–0.9)

## 2021-04-04 NOTE — Progress Notes (Signed)
Let patient know that his white blood cell count is low and looks like it has been low for several years.  I will refer him to a hematologist which is a blood specialist to evaluate further. Kidney and liver function test normal. HIV test negative. Screening test for hepatitis C negative. Cholesterol level normal.

## 2021-04-05 ENCOUNTER — Other Ambulatory Visit: Payer: Self-pay

## 2021-04-05 ENCOUNTER — Telehealth: Payer: Self-pay

## 2021-04-05 NOTE — Telephone Encounter (Signed)
Contacted pt to go over lab results pt didn't answer and was unable to lvm due to vm being full    Sent a CRM and forward labs to NT to give pt labs when they call back   

## 2021-04-10 ENCOUNTER — Ambulatory Visit (HOSPITAL_COMMUNITY)
Admission: RE | Admit: 2021-04-10 | Discharge: 2021-04-10 | Disposition: A | Discharge status: Home / Self Care | Payer: Commercial Managed Care - PPO | Source: Ambulatory Visit | Attending: Internal Medicine | Admitting: Internal Medicine

## 2021-04-10 ENCOUNTER — Other Ambulatory Visit: Payer: Self-pay

## 2021-04-10 DIAGNOSIS — M545 Low back pain, unspecified: Secondary | ICD-10-CM | POA: Insufficient documentation

## 2021-04-10 DIAGNOSIS — G8929 Other chronic pain: Secondary | ICD-10-CM | POA: Insufficient documentation

## 2021-04-14 ENCOUNTER — Telehealth: Payer: Self-pay | Admitting: Internal Medicine

## 2021-04-14 NOTE — Telephone Encounter (Signed)
PC placed to pt this a.m using [employer] [phone], [place]. We tried calling patient's preferred phone number twice.  The phone would ring and then there would be no answer and no voicemail.  We then tried calling his alternate contac which is listed as his wife Kyle Morris.  Message left informing that I am trying to reach him to go over the results of his MRI and that I will try to reach him again later.  Phone number given for our office.

## 2021-04-18 ENCOUNTER — Telehealth: Payer: Self-pay | Admitting: Internal Medicine

## 2021-04-18 NOTE — Telephone Encounter (Signed)
Letter has been sent

## 2021-04-18 NOTE — Telephone Encounter (Signed)
Phone call placed to patient again today to go over the results of his MRI.  His voicemail box is not set up and I was unable to leave a message.  I will send him a letter.

## 2021-05-11 ENCOUNTER — Telehealth: Payer: Self-pay | Admitting: Internal Medicine

## 2021-05-11 NOTE — Telephone Encounter (Signed)
Pt calling back after receiving Dr Laural Benes letter. He would like to speak with you directly about this.  Phone number [phone]  Pt is at home this afternoon.

## 2021-05-16 NOTE — Telephone Encounter (Signed)
Phone call placed to patient today using interpreter.  Interpreter [phone], name is Margarette Canada.  We left a message on patient's voicemail letting him know that I was returning his call and since I have not been able to reach him, we will discussed the results of his MRI when I see him on his upcoming appointment in December.

## 2021-06-08 ENCOUNTER — Ambulatory Visit: Payer: Commercial Managed Care - PPO | Attending: Internal Medicine | Admitting: Internal Medicine

## 2021-06-08 ENCOUNTER — Other Ambulatory Visit: Payer: Self-pay

## 2021-06-08 VITALS — BP 118/73 | HR 83 | Ht 63.0 in | Wt 125.0 lb

## 2021-06-08 DIAGNOSIS — M545 Low back pain, unspecified: Secondary | ICD-10-CM | POA: Diagnosis not present

## 2021-06-08 DIAGNOSIS — M5136 Other intervertebral disc degeneration, lumbar region: Secondary | ICD-10-CM | POA: Diagnosis not present

## 2021-06-08 DIAGNOSIS — G43009 Migraine without aura, not intractable, without status migrainosus: Secondary | ICD-10-CM | POA: Diagnosis not present

## 2021-06-08 DIAGNOSIS — G8929 Other chronic pain: Secondary | ICD-10-CM

## 2021-06-08 DIAGNOSIS — K130 Diseases of lips: Secondary | ICD-10-CM | POA: Diagnosis not present

## 2021-06-08 DIAGNOSIS — D72819 Decreased white blood cell count, unspecified: Secondary | ICD-10-CM | POA: Insufficient documentation

## 2021-06-08 MED ORDER — TOPIRAMATE 25 MG PO TABS: 25.0000 mg | ORAL_TABLET | Freq: Every day | ORAL | 1 refills | Status: DC

## 2021-06-08 MED ORDER — IBUPROFEN 600 MG PO TABS: 600.0000 mg | ORAL_TABLET | Freq: Three times a day (TID) | ORAL | 1 refills | Status: DC | PRN

## 2021-06-08 NOTE — Progress Notes (Signed)
Patient complain of headache. For 2 days.

## 2021-06-08 NOTE — Progress Notes (Signed)
Patient ID: Conrad Zajkowski, male    DOB: 03-11-1991  MRN: [id number]  CC: Back Pain   Subjective: Lateef Seidel is a 30 y.o. male who presents for f/u visit.  Wife is with him.  Pt declines interpreter His concerns today include:  Migraine HA, chronic LBP  Patient presents for follow-up on chronic lower back pain and for results of his MRI.  MRI revealed degenerative disc at L1-L2 with endplate Schmorl's nodes.  Also showed some degenerative disc at level L4-5 with a left L4 foraminal stenosis.  Still gets headaches which he associates with his back pain but not as frequent.  I had prescribed ibuprofen and Topamax for his headaches which sounded like migraines.  Patient does not have medications with him today and was not sure whether he filled the prescription for the Topamax.  However I found out from the pharmacy that he did fill it once for 15-day supply. -Complains of headache that he has had for the past 2 days.  This headache is a bit different from the other headaches that he used to get that would be all over the head.  The headaches for the past 2 days has been frontal and associated with nausea.  He denies any dizziness or photophobia.  Endorses some blurred vision.  He has not had any upper respiratory symptoms like cough runny nose.  No fever.  He took an ibuprofen this morning because of the headache.  C/o dry skin on lower lip x 1 yr.  Most noticeable when he wakes up in the mornings and when he brushes his teeth.  Does not feel it is any worse with climate change.  Uses Vaseline sometimes.  No ulcers or blisters on the lips.  I went over the results of his blood test that were done on last visit.  Labs came back okay except for low white blood cell count that looks like it has been persistently low for several years.  Patient Active Problem List   Diagnosis Date Noted   Chronic bilateral low back pain without sciatica 04/04/2021   Migraine without aura and  without status migrainosus, not intractable 04/04/2021     Current Outpatient Medications on File Prior to Visit  Medication Sig Dispense Refill   ibuprofen (ADVIL) 600 MG tablet Take 1 tablet (600 mg total) by mouth every 8 (eight) hours as needed. 60 tablet 1   tiZANidine (ZANAFLEX) 4 MG tablet Take 1 tablet (4 mg total) by mouth 2 (two) times daily as needed for muscle spasms. 30 tablet 0   topiramate (TOPAMAX) 25 MG tablet Take 1 tablet (25 mg total) by mouth 2 (two) times daily. 30 tablet 1   No current facility-administered medications on file prior to visit.    No Known Allergies  Social History   Socioeconomic History   Marital status: Married    Spouse name: Not on file   Number of children: Not on file   Years of education: Not on file   Highest education level: Not on file  Occupational History   Not on file  Tobacco Use   Smoking status: Never   Smokeless tobacco: Never  Vaping Use   Vaping Use: Never used  Substance and Sexual Activity   Alcohol use: Yes    Comment: socially   Drug use: Never   Sexual activity: Not on file  Other Topics Concern   Not on file  Social History Narrative   Not on file   Social  Determinants of Health   Financial Resource Strain: Not on file  Food Insecurity: Not on file  Transportation Needs: Not on file  Physical Activity: Not on file  Stress: Not on file  Social Connections: Not on file  Intimate Partner Violence: Not on file    Family History  Family history unknown: Yes    No past surgical history on file.  ROS: Review of Systems Negative except as stated above  PHYSICAL EXAM: BP 118/73   Pulse 83   Ht 5\' 3"  (1.6 m)   Wt 125 lb (56.7 kg)   SpO2 100%   BMI 22.14 kg/m   Physical Exam  General appearance - alert, well appearing, and in no distress Mental status - normal mood, behavior, speech, dress, motor activity, and thought processes Mouth -skin on the lips especially the lower legs is a bit dry  with slight peeling. Neurological - cranial nerves II through XII intact, motor and sensory grossly normal bilaterally, Romberg sign negative, normal gait and station.  No rigidity of the neck.   CMP Latest Ref Rng & Units 04/03/2021 12/16/2018 10/23/2018  Glucose 70 - 99 mg/dL 83 98 87  BUN 6 - 20 mg/dL 8 10/25/2018) 6  Creatinine <2(I - 1.27 mg/dL 7.12) 4.58(K) 9.98(P  Sodium 134 - 144 mmol/L 141 138 136  Potassium 3.5 - 5.2 mmol/L 4.4 3.9 5.1  Chloride 96 - 106 mmol/L 103 103 101  CO2 20 - 29 mmol/L 21 27 24   Calcium 8.7 - 10.2 mg/dL 9.1 9.4 8.9  Total Protein 6.0 - 8.5 g/dL 7.2 7.2 7.0  Total Bilirubin 0.0 - 1.2 mg/dL 0.9 3.82) )  Alkaline Phos 44 - 121 IU/L 59 59 45  AST 0 - 40 IU/L 22 33 36  ALT 0 - 44 IU/L 14 21 16    Lipid Panel     Component Value Date/Time   CHOL 133 04/03/2021 1200   TRIG 61 04/03/2021 1200   HDL 53 04/03/2021 1200   CHOLHDL 2.5 04/03/2021 1200   LDLCALC 67 04/03/2021 1200    CBC    Component Value Date/Time   WBC 2.8 (L) 04/03/2021 1200   WBC 3.4 (L) 12/16/2018 1656   RBC 5.58 04/03/2021 1200   RBC 5.64 12/16/2018 1656   HGB 16.0 04/03/2021 1200   HCT 48.6 04/03/2021 1200   PLT 192 04/03/2021 1200   MCV 87 04/03/2021 1200   MCH 28.7 04/03/2021 1200   MCH 29.1 12/16/2018 1656   MCHC 32.9 04/03/2021 1200   MCHC 33.4 12/16/2018 1656   RDW 12.7 04/03/2021 1200   LYMPHSABS 0.7 10/23/2018 1629   MONOABS 0.3 10/23/2018 1629   EOSABS 0.0 10/23/2018 1629   BASOSABS 0.0 10/23/2018 1629    ASSESSMENT AND PLAN: 1. Lip dryness Recommend using lip balm which can be purchased over-the-counter  2. Degenerative disc disease, lumbar 3. Chronic bilateral low back pain without sciatica -I went over the results of the MRI findings.  He is fairly young and is having chronic back pain associated with degenerative disc and Schmorl's nodes.  I will send him to spine specialist to see whether he would benefit from injections versus physical therapy.  Continue  ibuprofen as needed.  Advised to avoid activities that may cause flareup and back pain with excessive lifting, pushing pulling etc. - Ambulatory referral to Neurosurgery - ibuprofen (ADVIL) 600 MG tablet; Take 1 tablet (600 mg total) by mouth every 8 (eight) hours as needed for moderate pain.  Dispense: 60 tablet;  Refill: 1  4. Migraine without aura and without status migrainosus, not intractable I have sent a refill on Topamax but I will change it to 25 mg just at bedtime.  Use the ibuprofen for abortive therapy. - topiramate (TOPAMAX) 25 MG tablet; Take 1 tablet (25 mg total) by mouth at bedtime.  Dispense: 30 tablet; Refill: 1 - ibuprofen (ADVIL) 600 MG tablet; Take 1 tablet (600 mg total) by mouth every 8 (eight) hours as needed for moderate pain.  Dispense: 60 tablet; Refill: 1 - Ambulatory referral to Neurology  5. Leukopenia, unspecified type Looks like this is chronic  and had been stable for a while but the white blood cell count has decreased a little bit more on recent blood test.  We will refer him to a hematologist. - Ambulatory referral to Hematology / Oncology    Patient was given the opportunity to ask questions.  Patient verbalized understanding of the plan and was able to repeat key elements of the plan.   No orders of the defined types were placed in this encounter.    Requested Prescriptions    No prescriptions requested or ordered in this encounter    No follow-ups on file.  Jonah Blue, MD, FACP

## 2021-06-14 ENCOUNTER — Telehealth: Payer: Self-pay | Admitting: Oncology

## 2021-06-14 NOTE — Telephone Encounter (Signed)
Scheduled appt per 12/9 referral. Pt is aware of appt date and time. Per pt request I mailed a letter with the appt information.

## 2021-06-28 ENCOUNTER — Inpatient Hospital Stay: Payer: Commercial Managed Care - PPO | Attending: Oncology | Admitting: Oncology

## 2021-06-28 ENCOUNTER — Other Ambulatory Visit: Payer: Self-pay

## 2021-06-28 VITALS — BP 116/72 | HR 79 | Temp 97.9°F | Resp 17 | Wt 150.0 lb

## 2021-06-28 DIAGNOSIS — Z79899 Other long term (current) drug therapy: Secondary | ICD-10-CM | POA: Diagnosis not present

## 2021-06-28 DIAGNOSIS — G43909 Migraine, unspecified, not intractable, without status migrainosus: Secondary | ICD-10-CM | POA: Insufficient documentation

## 2021-06-28 DIAGNOSIS — Z791 Long term (current) use of non-steroidal anti-inflammatories (NSAID): Secondary | ICD-10-CM | POA: Insufficient documentation

## 2021-06-28 DIAGNOSIS — D72819 Decreased white blood cell count, unspecified: Secondary | ICD-10-CM | POA: Diagnosis not present

## 2021-06-28 NOTE — Progress Notes (Signed)
Reason for the request:    Leukocytopenia  HPI: I was asked by Dr. Wynetta Emery to evaluate [name] for the evaluation of leukocytopenia.  He is 30 year old man native of the [place] and has been living in this area for the last 7 years.  He is a rather healthy gentleman with history of migraine headaches and lower back pain but no other complaints.  He was noted to have leukocytopenia since 2016.  Laboratory data from April 03, 2021 showed a white cell count of 2.8, hemoglobin of 16 and a platelet count of 192.  Previous white cell count fluctuated between 3.2 and 3.8 since 2016.  His differential has been normal with neutrophils, lymphocytes all reasonably proportional.  He reports no symptoms related to this issue.  He denies any recurrent infections or hospitalizations.  He denies any recurrent sinus infections.  He denies any recurrent cellulitis or pneumonia.  He does report history of dry lips which has been recurrent for the last 3 years.   He does not report any headaches, blurry vision, syncope or seizures. Does not report any fevers, chills or sweats.  Does not report any cough, wheezing or hemoptysis.  Does not report any chest pain, palpitation, orthopnea or leg edema.  Does not report any nausea, vomiting or abdominal pain.  Does not report any constipation or diarrhea.  Does not report any skeletal complaints.    Does not report frequency, urgency or hematuria.  Does not report any skin rashes or lesions. Does not report any heat or cold intolerance.  Does not report any lymphadenopathy or petechiae.  Does not report any anxiety or depression.  Remaining review of systems is negative.   His past medical history significant for migraine headaches and lower back pain.  He denies any history of hypertension, diabetes or heart disease.   Current Outpatient Medications:    ibuprofen (ADVIL) 600 MG tablet, Take 1 tablet (600 mg total) by mouth every 8 (eight) hours as needed for moderate pain.,  Disp: 60 tablet, Rfl: 1   tiZANidine (ZANAFLEX) 4 MG tablet, Take 1 tablet (4 mg total) by mouth 2 (two) times daily as needed for muscle spasms., Disp: 30 tablet, Rfl: 0   topiramate (TOPAMAX) 25 MG tablet, Take 1 tablet (25 mg total) by mouth at bedtime., Disp: 30 tablet, Rfl: 1:  No Known Allergies:   Family History  Family history unknown: Yes  :   Social History   Socioeconomic History   Marital status: Married    Spouse name: Not on file   Number of children: Not on file   Years of education: Not on file   Highest education level: Not on file  Occupational History   Not on file  Tobacco Use   Smoking status: Never   Smokeless tobacco: Never  Vaping Use   Vaping Use: Never used  Substance and Sexual Activity   Alcohol use: Yes    Comment: socially   Drug use: Never   Sexual activity: Not on file  Other Topics Concern   Not on file  Social History Narrative   Not on file   Social Determinants of Health   Financial Resource Strain: Not on file  Food Insecurity: Not on file  Transportation Needs: Not on file  Physical Activity: Not on file  Stress: Not on file  Social Connections: Not on file  Intimate Partner Violence: Not on file  :  Pertinent items are noted in HPI.  Exam:  General appearance: alert  and cooperative appeared without distress. Head: atraumatic without any abnormalities. Eyes: conjunctivae/corneas clear. PERRL.  Sclera anicteric. Throat: lips, mucosa, and tongue normal; without oral thrush or ulcers. Resp: clear to auscultation bilaterally without rhonchi, wheezes or dullness to percussion. Cardio: regular rate and rhythm, S1, S2 normal, no murmur, click, rub or gallop GI: soft, non-tender; bowel sounds normal; no masses,  no organomegaly Skin: Skin color, texture, turgor normal. No rashes or lesions Lymph nodes: Cervical, supraclavicular, and axillary nodes normal. Neurologic: Grossly normal without any motor, sensory or deep tendon  reflexes. Musculoskeletal: No joint deformity or effusion.    Assessment and Plan:   30 year old with:  1.  Leukocytopenia dating back to 2016 with white cell count fluctuating between 3.8 and most recently 2.8.  His differential is within normal range.  The differential diagnosis was discussed today at this time.  Benign ethnic variation and other benign etiology remains the most likely etiology at this time.  Given his age, presentation and the mild fluctuation in his leukocytopenia, I do not feel that that there is a primary hematological conditions.  Conditions such as lymphoproliferative disorder, myelodysplastic syndrome or any autoimmune disease are considered likely.  From a management standpoint, I recommended no further intervention or management at this time.  His risk of recurrent infection is very low at this time and does not require any growth factor support or management.  He had similar counts probably the majority of his life without any issues.  2.  Follow-up: I am happy to see him in the future as needed.  45  minutes were dedicated to this visit. The time was spent on reviewing laboratory data, discussing treatment options, discussing differential diagnosis and answering questions regarding future plan.    A copy of this consult has been forwarded to the requesting physician.

## 2021-08-02 ENCOUNTER — Encounter: Payer: Self-pay | Admitting: Psychiatry

## 2021-08-02 ENCOUNTER — Ambulatory Visit: Payer: Commercial Managed Care - PPO | Admitting: Psychiatry

## 2021-08-23 ENCOUNTER — Other Ambulatory Visit: Payer: Self-pay | Admitting: Internal Medicine

## 2021-08-23 DIAGNOSIS — M545 Low back pain, unspecified: Secondary | ICD-10-CM

## 2021-08-23 MED ORDER — TIZANIDINE HCL 4 MG PO TABS: 4.0000 mg | ORAL_TABLET | Freq: Two times a day (BID) | ORAL | 2 refills | Status: DC | PRN

## 2021-08-29 ENCOUNTER — Ambulatory Visit: Payer: Commercial Managed Care - PPO | Admitting: Internal Medicine

## 2021-08-29 ENCOUNTER — Encounter: Payer: Self-pay | Admitting: Oncology

## 2021-08-29 ENCOUNTER — Encounter: Payer: Self-pay | Admitting: Internal Medicine

## 2021-10-04 ENCOUNTER — Encounter: Payer: Self-pay | Admitting: Internal Medicine

## 2021-10-04 ENCOUNTER — Ambulatory Visit: Payer: Commercial Managed Care - PPO | Attending: Internal Medicine | Admitting: Internal Medicine

## 2021-10-04 VITALS — BP 115/78 | HR 70 | Resp 16 | Ht 64.0 in | Wt 158.2 lb

## 2021-10-04 DIAGNOSIS — M25561 Pain in right knee: Secondary | ICD-10-CM | POA: Diagnosis not present

## 2021-10-04 DIAGNOSIS — G4489 Other headache syndrome: Secondary | ICD-10-CM | POA: Diagnosis not present

## 2021-10-04 DIAGNOSIS — R109 Unspecified abdominal pain: Secondary | ICD-10-CM | POA: Diagnosis not present

## 2021-10-04 LAB — POCT URINALYSIS DIP (CLINITEK)
Bilirubin, UA: NEGATIVE
Blood, UA: NEGATIVE
Glucose, UA: NEGATIVE mg/dL
Ketones, POC UA: NEGATIVE mg/dL
Leukocytes, UA: NEGATIVE
Nitrite, UA: NEGATIVE
POC PROTEIN,UA: NEGATIVE
Spec Grav, UA: 1.02 (ref 1.010–1.025)
Urobilinogen, UA: 0.2 E.U./dL
pH, UA: 7 (ref 5.0–8.0)

## 2021-10-04 MED ORDER — TOPIRAMATE 25 MG PO TABS: 25.0000 mg | ORAL_TABLET | Freq: Every day | ORAL | 5 refills | Status: DC

## 2021-10-04 MED ORDER — CYCLOBENZAPRINE HCL 5 MG PO TABS: 5.0000 mg | ORAL_TABLET | Freq: Every day | ORAL | 1 refills | Status: DC | PRN

## 2021-10-04 MED ORDER — IBUPROFEN 600 MG PO TABS: 600.0000 mg | ORAL_TABLET | Freq: Three times a day (TID) | ORAL | 1 refills | Status: DC | PRN

## 2021-10-04 NOTE — Progress Notes (Signed)
? ? ?Patient ID: Kyle Morris, male    DOB: 1990-07-07  MRN: [id number] ? ?CC: Back Pain (Pain comes and goes /) and Headache (Pain comes and goes/Started a year ago ) ? ? ?Subjective: ?Kyle Morris is a 31 y.o. male who presents for UC visit with several concerns.  He declines interpreter.  He speaks [place]. 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 ?2 wks ago he had a lot of pain on his LT side  below the lateral rib cage without precipitating factors.  Pain was worse with movement even turning over in bed.  Endorsed having subjective slight fever.  No dysuria or hematuria.  Pain was not made better or worse with food.  No associated nausea or vomiting.  Finally eased up yesterday and today. ?-Reports that his chronic lower back pain is improved.   ? ?Still gets headaches that comes and goes but over past few wks they have changed in character.  He has episodes where headache comes on suddenly on both sides of the head.  It is a sharp pain that lasts for about 5 minutes.  Can occur 2 to 3 days a week.  Endorses blurred vision with the episodes.  No nausea or vomiting.  Different from his previous headaches that could last the whole day.  Last time he had typical migraine type headache lasting a date was about 2 weeks ago.  He is supposed to be on Topamax for prophylaxis and ibuprofen for abortive therapy.  He tells me that he takes the Topamax only if he is having severe headache.  Takes ibuprofen a few times a week. ? ?C/o pain in RT knee sometimes. Had pain in it 3 days ago. Hurts to stand for long periods at time ?No swelling.  No known precipitating factors. ? ? ?Patient Active Problem List  ? Diagnosis Date Noted  ? Leukopenia 06/08/2021  ? Chronic bilateral low back pain without sciatica 04/04/2021  ? Migraine without aura and without status migrainosus, not intractable 04/04/2021  ?  ? ?Current Outpatient Medications on File Prior to Visit   ?Medication Sig Dispense Refill  ? ibuprofen (ADVIL) 600 MG tablet Take 1 tablet (600 mg total) by mouth every 8 (eight) hours as needed for moderate pain. 60 tablet 1  ? tiZANidine (ZANAFLEX) 4 MG tablet Take 1 tablet (4 mg total) by mouth 2 (two) times daily as needed for muscle spasms. 30 tablet 2  ? topiramate (TOPAMAX) 25 MG tablet Take 1 tablet (25 mg total) by mouth at bedtime. 30 tablet 1  ? ?No current facility-administered medications on file prior to visit.  ? ? ?No Known Allergies ? ?Social History  ? ?Socioeconomic History  ? Marital status: Married  ?  Spouse name: Not on file  ? Number of children: Not on file  ? Years of education: Not on file  ? Highest education level: Not on file  ?Occupational History  ? Not on file  ?Tobacco Use  ? Smoking status: Never  ? Smokeless tobacco: Never  ?Vaping Use  ? Vaping Use: Never used  ?Substance and Sexual Activity  ? Alcohol use: Yes  ?  Comment: socially  ? Drug use: Never  ? Sexual activity: Not on file  ?Other Topics Concern  ? Not on file  ?Social History Narrative  ? Not on file  ? ?Social Determinants of Health  ? ?Financial Resource Strain: Not on file  ?Food Insecurity: Not on file  ?  Transportation Needs: Not on file  ?Physical Activity: Not on file  ?Stress: Not on file  ?Social Connections: Not on file  ?Intimate Partner Violence: Not on file  ? ? ?Family History  ?Family history unknown: Yes  ? ? ?No past surgical history on file. ? ?ROS: ?Review of Systems ?Negative except as stated above ? ?PHYSICAL EXAM: ?BP 115/78   Pulse 70   Resp 16   Ht 5\' 4"  (1.626 m)   Wt 158 lb 3.2 oz (71.8 kg)   SpO2 99%   BMI 27.15 kg/m?   ?Physical Exam ? ? ?General appearance - alert, well appearing, young African male and in no distress ?Mental status - normal mood, behavior, speech, dress, motor activity, and thought processes ?Chest - clear to auscultation, no wheezes, rales or rhonchi, symmetric air entry ?Heart - normal rate, regular rhythm, normal S1, S2,  no murmurs, rubs, clicks or gallops ?Abdomen -nondistended, normal bowel sounds, soft.  Slight tenderness in the left mid/ lateral abdomen without guarding or rebound.  Positive left flank tenderness. ?Neurological - cranial nerves II through XII intact, normal muscle tone, no tremors, strength 5/5.  Gross sensation intact.  Gait is normal. ?Musculoskeletal -right knee: Slight enlargement but does not appear to have any significant effusion/edema.  No point tenderness on palpation of the medial and lateral joint lines.  Good range of motion without crepitus. ?Extremities - peripheral pulses normal, no pedal edema, no clubbing or cyanosis ? ? ?  Latest Ref Rng & Units 04/03/2021  ? 12:00 PM 12/16/2018  ?  5:16 PM 12/16/2018  ?  4:56 PM  ?CMP  ?Glucose 70 - 99 mg/dL 83    98    ?BUN 6 - 20 mg/dL 8    <5    ?Creatinine 0.76 - 1.27 mg/dL 8.650.59    7.840.56    ?Sodium 134 - 144 mmol/L 141    138    ?Potassium 3.5 - 5.2 mmol/L 4.4    3.9    ?Chloride 96 - 106 mmol/L 103    103    ?CO2 20 - 29 mmol/L 21    27    ?Calcium 8.7 - 10.2 mg/dL 9.1    9.4    ?Total Protein 6.0 - 8.5 g/dL 7.2   7.2     ?Total Bilirubin 0.0 - 1.2 mg/dL 0.9   1.4     ?Alkaline Phos 44 - 121 IU/L 59   59     ?AST 0 - 40 IU/L 22   33     ?ALT 0 - 44 IU/L 14   21     ? ?Lipid Panel  ?   ?Component Value Date/Time  ? CHOL 133 04/03/2021 1200  ? TRIG 61 04/03/2021 1200  ? HDL 53 04/03/2021 1200  ? CHOLHDL 2.5 04/03/2021 1200  ? LDLCALC 67 04/03/2021 1200  ? ? ?CBC ?   ?Component Value Date/Time  ? WBC 2.8 (L) 04/03/2021 1200  ? WBC 3.4 (L) 12/16/2018 1656  ? RBC 5.58 04/03/2021 1200  ? RBC 5.64 12/16/2018 1656  ? HGB 16.0 04/03/2021 1200  ? HCT 48.6 04/03/2021 1200  ? PLT 192 04/03/2021 1200  ? MCV 87 04/03/2021 1200  ? MCH 28.7 04/03/2021 1200  ? MCH 29.1 12/16/2018 1656  ? MCHC 32.9 04/03/2021 1200  ? MCHC 33.4 12/16/2018 1656  ? RDW 12.7 04/03/2021 1200  ? LYMPHSABS 0.7 10/23/2018 1629  ? MONOABS 0.3 10/23/2018 1629  ? EOSABS 0.0 10/23/2018 1629  ?  BASOSABS  0.0 10/23/2018 1629  ? ?Results for orders placed or performed in visit on 10/04/21  ?POCT URINALYSIS DIP (CLINITEK)  ?Result Value Ref Range  ? Color, UA yellow yellow  ? Clarity, UA clear clear  ? Glucose, UA negative negative mg/dL  ? Bilirubin, UA negative negative  ? Ketones, POC UA negative negative mg/dL  ? Spec Grav, UA 1.020 1.010 - 1.025  ? Blood, UA negative negative  ? pH, UA 7.0 5.0 - 8.0  ? POC PROTEIN,UA negative negative, trace  ? Urobilinogen, UA 0.2 0.2 or 1.0 E.U./dL  ? Nitrite, UA Negative Negative  ? Leukocytes, UA Negative Negative  ? ? ?ASSESSMENT AND PLAN: ?1. Left sided abdominal pain ?More on his left lateral side.  I suspect likely musculoskeletal in nature. ?Recommend ibuprofen as needed.  I have also added the muscle relaxant Flexeril to take as needed.  Warned that it can cause drowsiness.  Follow-up if no improvement. ?- POCT URINALYSIS DIP (CLINITEK) ?- cyclobenzaprine (FLEXERIL) 5 MG tablet; Take 1 tablet (5 mg total) by mouth daily as needed for muscle spasms.  Dispense: 30 tablet; Refill: 1 ? ?2. Headache syndrome ?Patient with history of migraines that have changed in character becoming sudden onset and short lasting. Does not meet all criteria for SUNCT.  Recommend taking Topamax every night for migraine prophylaxis as previously prescribed.  I will refer him to neurology given the change in character of his headaches. ?- Ambulatory referral to Neurology ?- topiramate (TOPAMAX) 25 MG tablet; Take 1 tablet (25 mg total) by mouth at bedtime.  Dispense: 30 tablet; Refill: 5 ? ?3. Acute pain of right knee ?Rule out bursitis versus early arthritis.  Recommend ibuprofen as needed. ?- ibuprofen (ADVIL) 600 MG tablet; Take 1 tablet (600 mg total) by mouth every 8 (eight) hours as needed for moderate pain.  Dispense: 60 tablet; Refill: 1 ? ? ? ?Patient was given the opportunity to ask questions.  Patient verbalized understanding of the plan and was able to repeat key elements of the plan.   ? ?This documentation was completed using Paediatric nurse.  Any transcriptional errors are unintentional. ? ?No orders of the defined types were placed in this encounter. ? ? ? ?Reques

## 2021-10-26 ENCOUNTER — Ambulatory Visit: Payer: Commercial Managed Care - PPO | Admitting: Nurse Practitioner

## 2021-10-30 ENCOUNTER — Ambulatory Visit: Payer: Commercial Managed Care - PPO | Admitting: Internal Medicine

## 2021-11-09 ENCOUNTER — Ambulatory Visit: Payer: Commercial Managed Care - PPO | Admitting: Psychiatry
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
## 2021-12-07 ENCOUNTER — Ambulatory Visit: Payer: Commercial Managed Care - PPO | Admitting: Psychiatry

## 2022-04-10 ENCOUNTER — Ambulatory Visit: Payer: Commercial Managed Care - PPO | Admitting: Physician Assistant

## 2022-04-11 ENCOUNTER — Ambulatory Visit (INDEPENDENT_AMBULATORY_CARE_PROVIDER_SITE_OTHER): Payer: Commercial Managed Care - PPO | Admitting: Primary Care

## 2022-05-08 ENCOUNTER — Ambulatory Visit (INDEPENDENT_AMBULATORY_CARE_PROVIDER_SITE_OTHER): Payer: Commercial Managed Care - PPO | Admitting: Primary Care

## 2022-06-07 ENCOUNTER — Ambulatory Visit (INDEPENDENT_AMBULATORY_CARE_PROVIDER_SITE_OTHER): Payer: Commercial Managed Care - PPO | Admitting: Primary Care

## 2022-07-31 ENCOUNTER — Ambulatory Visit: Payer: Self-pay

## 2022-07-31 NOTE — Telephone Encounter (Addendum)
Call placed with use of interpeter([facility]) unable to reach. Called to schedule for OV . message left on voice mail.

## 2022-07-31 NOTE — Telephone Encounter (Signed)
  Chief Complaint: testicle pain Symptoms: bilat testicular pain, comes and goes, today not as bad, dizziness at times  Frequency: 2 months  Pertinent Negatives: Patient denies swelling or redness Disposition: [] ED /[x] Urgent Care (no appt availability in office) / [] Appointment(In office/virtual)/ []  [facility]/ [] Home Care/ [] Refused Recommended Disposition /[] [facility]/ []  Follow-up with PCP Additional Notes: pt rescheduled appt from 08/02/22 to 09/20/22 d/t being unable to drive d/t pain and dizziness. Pt states he doesn't have anyone else to take him. I advised him to go to UC if he can find someone to take him. Pt verbalized understanding and unsure if he'll go or not.   Reason for Disposition  [1] Pain comes and goes (intermittent) AND [2] present > 24 hours  Answer Assessment - Initial Assessment Questions 1. LOCATION and RADIATION: "Where is the pain located?"      Bilat pain  3. SEVERITY: "How bad is the pain?"  (Scale 1-10; or mild, moderate, severe)   - MILD (1-3): doesn't interfere with normal activities    - MODERATE (4-7): interferes with normal activities (e.g., work or school) or awakens from sleep   - SEVERE (8-10): excruciating pain, unable to do any normal activities, difficulty walking     7/10 4. ONSET: "When did the pain start?"     2 months  5. PATTERN: "Does it come and go, or has it been constant since it started?"     Comes and goes  6. SCROTAL APPEARANCE: "What does the scrotum look like?" "Is there any swelling or redness?"      no 8. OTHER SYMPTOMS: "Do you have any other symptoms?" (e.g., fever, abdominal pain, vomiting, difficulty passing urine)     Dizziness at times  Protocols used: Scrotal Pain-A-AH

## 2022-08-02 ENCOUNTER — Ambulatory Visit: Payer: Commercial Managed Care - PPO | Admitting: Internal Medicine

## 2022-09-20 ENCOUNTER — Ambulatory Visit: Payer: Commercial Managed Care - PPO | Admitting: Nurse Practitioner

## 2022-10-30 ENCOUNTER — Encounter: Payer: Self-pay | Admitting: Nurse Practitioner

## 2022-10-30 ENCOUNTER — Ambulatory Visit: Payer: Medicaid Other | Attending: Internal Medicine | Admitting: Nurse Practitioner

## 2022-10-30 ENCOUNTER — Encounter: Payer: Self-pay | Admitting: Internal Medicine

## 2022-10-30 VITALS — BP 124/83 | HR 76 | Ht 64.0 in | Wt 162.4 lb

## 2022-10-30 DIAGNOSIS — M5441 Lumbago with sciatica, right side: Secondary | ICD-10-CM

## 2022-10-30 DIAGNOSIS — N50811 Right testicular pain: Secondary | ICD-10-CM

## 2022-10-30 DIAGNOSIS — R17 Unspecified jaundice: Secondary | ICD-10-CM | POA: Diagnosis not present

## 2022-10-30 DIAGNOSIS — D72819 Decreased white blood cell count, unspecified: Secondary | ICD-10-CM

## 2022-10-30 DIAGNOSIS — G8929 Other chronic pain: Secondary | ICD-10-CM

## 2022-10-30 MED ORDER — GABAPENTIN 300 MG PO CAPS: 300.0000 mg | ORAL_CAPSULE | Freq: Three times a day (TID) | ORAL | 3 refills | Status: DC

## 2022-10-30 NOTE — Progress Notes (Signed)
?????? ??? ??   5? 4?? ?? ?? ?? ????? ?? ??? ????.  ?? ? ?? ???????? ???????? ??? ?? ???? ?????. ???? ?? ???! ???

## 2022-10-30 NOTE — Progress Notes (Addendum)
Assessment & Plan:  Kyle Morris was seen today for penis pain and extremity weakness.  Diagnoses and all orders for this visit:  Pain in right testicle -     US Scrotum; Future  Elevated bilirubin -     CMP14+EGFR  Leukopenia, unspecified type -     Thyroid Panel With TSH -     CBC with Differential  Chronic right-sided low back pain with right-sided sciatica -     Ambulatory referral to Orthopedics -     gabapentin (NEURONTIN) 300 MG capsule; Take 1 capsule (300 mg total) by mouth 3 (three) times daily.    Patient has been counseled on age-appropriate routine health concerns for screening and prevention. These are reviewed and up-to-date. Referrals have been placed accordingly. Immunizations are up-to-date or declined.    Subjective:   Chief Complaint  Patient presents with   Penis Pain   Extremity Weakness    Kyle Morris 32 y.o. male presents to office today with painful right testicle.   VRI was used to communicate directly with patient for the entire encounter including providing detailed patient instructions.    Testicular pain Onset of right testicular pain was 2 months ago. Pain is intermittent and described as a pinching sensation. Denies testicular swelling, penile lesions or drainage.    DDD Patient presents for follow-up on chronic lower back pain.  MRI in 2022 revealed degenerative disc at L1-L2 with endplate Schmorl's nodes.  Also showed some degenerative disc at level L4-5 with a left L4 foraminal stenosis. Associated symptoms: right sided sciatica with pain and numbness.  He was referred to neurosurgery in the past and was lost to follow up.   Review of Systems  Constitutional:  Negative for fever, malaise/fatigue and weight loss.  HENT: Negative.  Negative for nosebleeds.   Eyes: Negative.  Negative for blurred vision, double vision and photophobia.  Respiratory: Negative.  Negative for cough and shortness of breath.   Cardiovascular:  Negative.  Negative for chest pain, palpitations and leg swelling.  Gastrointestinal: Negative.  Negative for heartburn, nausea and vomiting.  Genitourinary:        Scrotal pain  Musculoskeletal:  Positive for back pain. Negative for myalgias.  Neurological:  Positive for sensory change. Negative for dizziness, focal weakness, seizures and headaches.  Psychiatric/Behavioral: Negative.  Negative for suicidal ideas.     History reviewed. No pertinent past medical history.  History reviewed. No pertinent surgical history.  Family History  Family history unknown: Yes    Social History Reviewed with no changes to be made today.   Outpatient Medications Prior to Visit  Medication Sig Dispense Refill   cyclobenzaprine (FLEXERIL) 5 MG tablet Take 1 tablet (5 mg total) by mouth daily as needed for muscle spasms. (Patient not taking: Reported on 10/30/2022) 30 tablet 1   ibuprofen (ADVIL) 600 MG tablet Take 1 tablet (600 mg total) by mouth every 8 (eight) hours as needed for moderate pain. (Patient not taking: Reported on 10/30/2022) 60 tablet 1   topiramate (TOPAMAX) 25 MG tablet Take 1 tablet (25 mg total) by mouth at bedtime. (Patient not taking: Reported on 10/30/2022) 30 tablet 5   No facility-administered medications prior to visit.    No Known Allergies     Objective:    BP 124/83 (BP Location: Left Arm, Patient Position: Sitting, Cuff Size: Normal)   Pulse 76   Ht 5\' 4"  (1.626 m)   Wt 162 lb 6.4 oz (73.7 kg)   SpO2 99%  BMI 27.88 kg/m  Wt Readings from Last 3 Encounters:  10/30/22 162 lb 6.4 oz (73.7 kg)  10/04/21 158 lb 3.2 oz (71.8 kg)  06/28/21 150 lb (68 kg)    Physical Exam Vitals and nursing note reviewed.  Constitutional:      Appearance: He is well-developed.  HENT:     Head: Normocephalic and atraumatic.  Cardiovascular:     Rate and Rhythm: Normal rate and regular rhythm.     Heart sounds: Normal heart sounds. No murmur heard.    No friction rub. No gallop.   Pulmonary:     Effort: Pulmonary effort is normal. No tachypnea or respiratory distress.     Breath sounds: Normal breath sounds. No decreased breath sounds, wheezing, rhonchi or rales.  Chest:     Chest wall: No tenderness.  Abdominal:     General: Bowel sounds are normal.     Palpations: Abdomen is soft.  Musculoskeletal:        General: Normal range of motion.     Cervical back: Normal range of motion.  Skin:    General: Skin is warm and dry.  Neurological:     Mental Status: He is alert and oriented to person, place, and time.     Coordination: Coordination normal.  Psychiatric:        Behavior: Behavior normal. Behavior is cooperative.        Thought Content: Thought content normal.        Judgment: Judgment normal.          Patient has been counseled extensively about nutrition and exercise as well as the importance of adherence with medications and regular follow-up. The patient was given clear instructions to go to ER or return to medical center if symptoms don't improve, worsen or new problems develop. The patient verbalized understanding.   Follow-up: Return if symptoms worsen or fail to improve.   Claiborne Rigg, FNP-BC [facility] [place], [place] [phone]   10/30/2022, 4:07 PM

## 2022-10-30 NOTE — Addendum Note (Signed)
Addended by: Arbie Cookey on: 10/30/2022 04:40 PM   Modules accepted: Orders

## 2022-10-30 NOTE — Addendum Note (Signed)
Addended by: Arbie Cookey on: 10/30/2022 04:16 PM   Modules accepted: Orders

## 2022-10-31 ENCOUNTER — Telehealth: Payer: Self-pay

## 2022-10-31 LAB — CMP14+EGFR
ALT: 24 IU/L (ref 0–44)
AST: 27 IU/L (ref 0–40)
Albumin/Globulin Ratio: 1.7 (ref 1.2–2.2)
Albumin: 4.7 g/dL (ref 4.1–5.1)
Alkaline Phosphatase: 61 IU/L (ref 44–121)
BUN/Creatinine Ratio: 9 (ref 9–20)
BUN: 5 mg/dL — ABNORMAL LOW (ref 6–20)
Bilirubin Total: 1 mg/dL (ref 0.0–1.2)
CO2: 21 mmol/L (ref 20–29)
Calcium: 9.5 mg/dL (ref 8.7–10.2)
Chloride: 106 mmol/L (ref 96–106)
Creatinine, Ser: 0.58 mg/dL — ABNORMAL LOW (ref 0.76–1.27)
Globulin, Total: 2.8 g/dL (ref 1.5–4.5)
Glucose: 89 mg/dL (ref 70–99)
Potassium: 4.3 mmol/L (ref 3.5–5.2)
Sodium: 141 mmol/L (ref 134–144)
Total Protein: 7.5 g/dL (ref 6.0–8.5)
eGFR: 131 mL/min/{1.73_m2} (ref >59)

## 2022-10-31 LAB — CBC WITH DIFFERENTIAL/PLATELET
Basophils Absolute: 0 10*3/uL (ref 0.0–0.2)
Basos: 0 %
EOS (ABSOLUTE): 0.1 10*3/uL (ref 0.0–0.4)
Eos: 3 %
Hematocrit: 48.4 % (ref 37.5–51.0)
Hemoglobin: 16.6 g/dL (ref 13.0–17.7)
Immature Grans (Abs): 0 10*3/uL (ref 0.0–0.1)
Immature Granulocytes: 1 %
Lymphocytes Absolute: 0.9 10*3/uL (ref 0.7–3.1)
Lymphs: 35 %
MCH: 29.6 pg (ref 26.6–33.0)
MCHC: 34.3 g/dL (ref 31.5–35.7)
MCV: 86 fL (ref 79–97)
Monocytes Absolute: 0.2 10*3/uL (ref 0.1–0.9)
Monocytes: 9 %
Neutrophils Absolute: 1.4 10*3/uL (ref 1.4–7.0)
Neutrophils: 52 %
Platelets: 173 10*3/uL (ref 150–450)
RBC: 5.6 x10E6/uL (ref 4.14–5.80)
RDW: 13.4 % (ref 11.6–15.4)
WBC: 2.7 10*3/uL — ABNORMAL LOW (ref 3.4–10.8)

## 2022-10-31 LAB — THYROID PANEL WITH TSH
Free Thyroxine Index: 2.5 (ref 1.2–4.9)
T3 Uptake Ratio: 30 % (ref 24–39)
T4, Total: 8.3 ug/dL (ref 4.5–12.0)
TSH: 4.69 u[IU]/mL — ABNORMAL HIGH (ref 0.450–4.500)

## 2022-10-31 NOTE — Telephone Encounter (Signed)
Interpreter [place] ID# [id number]. Patient informed of [place] that has been scheduled. Patient identified by name and date of birth.  Patient aware of scheduled appointment and voiced understanding of rescheduling if needed.

## 2022-11-08 ENCOUNTER — Ambulatory Visit: Payer: Medicaid Other | Admitting: Orthopedic Surgery

## 2022-11-29 ENCOUNTER — Other Ambulatory Visit: Payer: Medicaid Other

## 2023-05-09 ENCOUNTER — Telehealth: Payer: Self-pay | Admitting: Internal Medicine

## 2023-05-09 ENCOUNTER — Encounter: Payer: Self-pay | Admitting: Internal Medicine

## 2023-05-09 ENCOUNTER — Ambulatory Visit: Payer: Commercial Managed Care - PPO | Attending: Internal Medicine | Admitting: Internal Medicine

## 2023-05-09 VITALS — BP 115/77 | HR 69 | Wt 155.6 lb

## 2023-05-09 DIAGNOSIS — Z23 Encounter for immunization: Secondary | ICD-10-CM | POA: Diagnosis not present

## 2023-05-09 DIAGNOSIS — G43009 Migraine without aura, not intractable, without status migrainosus: Secondary | ICD-10-CM | POA: Diagnosis not present

## 2023-05-09 DIAGNOSIS — R7989 Other specified abnormal findings of blood chemistry: Secondary | ICD-10-CM

## 2023-05-09 DIAGNOSIS — R200 Anesthesia of skin: Secondary | ICD-10-CM

## 2023-05-09 DIAGNOSIS — L309 Dermatitis, unspecified: Secondary | ICD-10-CM | POA: Diagnosis not present

## 2023-05-09 MED ORDER — TOPIRAMATE 25 MG PO TABS: ORAL_TABLET | ORAL | 2 refills | Status: DC

## 2023-05-09 MED ORDER — TRIAMCINOLONE ACETONIDE 0.1 % EX CREA: TOPICAL_CREAM | CUTANEOUS | 0 refills | Status: AC

## 2023-05-09 MED ORDER — KETOCONAZOLE 2 % EX CREA
TOPICAL_CREAM | CUTANEOUS | 0 refills | Status: AC
Start: 2023-05-09 — End: ?

## 2023-05-09 NOTE — Telephone Encounter (Signed)
Dois Davenport calling from  [facility] [place] - [address] Phone: [phone]  Fax: [phone]    Reporting the patient is a high audit risk plan needing clarification for ketoconazole (NIZORAL) 2 % cream [[id number]] insurance requires specific grams per application per location please advise

## 2023-05-09 NOTE — Progress Notes (Signed)
Patient ID: Kyle Morris, male    DOB: [birth year]  MRN: [id number]  CC: Headache and Leg Swelling (Patient stated when it happens when standing )   Subjective: Kyle Morris is a 32 y.o. male who presents with several concerns.  Wife is with him. 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  Can last 5-10 mins.  Feels like knees get weak.  Goes away when he starts walking.  In looking through his chart, I note that his TSH was mildly elevated when he saw our nurse practitioner earlier this year.  C/o daily HA x 2-3 mths as well.  Frontal especially on LT side No N/V/blurred vision or photophobia/dizziness/tearing or sinus congestion associated with HA.  Last 20-30 mins if he takes Tylenol 2 tabs ? 500mg ; takes Tylenol about 3 days in a wk depending on intensity of HA.  If he does not take anything it can last hrs.   Known history of migraines.  When I last saw him in April 2023, we left him on Topamax for prophylaxis which was working well for him and was to use ibuprofen for abortive therapy.  He has been out of Topamax for a while.  C/o of itchy dry skin x 3 yrs. Especially on trunk Uses Vaseline which causes increase itching.  Skin comes off when he scratches.   Patient Active Problem List   Diagnosis Date Noted   Leukopenia 06/08/2021   Chronic bilateral low back pain without sciatica 04/04/2021   Migraine without aura and without status migrainosus, not intractable 04/04/2021     Current Outpatient Medications on File Prior to Visit  Medication Sig Dispense Refill   gabapentin (NEURONTIN) 300 MG capsule Take 1 capsule (300 mg total) by mouth 3 (three) times daily. (Patient not taking: Reported on 05/09/2023) 90 capsule 3   No current facility-administered medications on file prior to visit.    No Known  Allergies  Social History   Socioeconomic History   Marital status: Married    Spouse name: Not on file   Number of children: Not on file   Years of education: Not on file   Highest education level: Not on file  Occupational History   Not on file  Tobacco Use   Smoking status: Never   Smokeless tobacco: Never  Vaping Use   Vaping status: Never Used  Substance and Sexual Activity   Alcohol use: Yes    Comment: socially   Drug use: Never   Sexual activity: Not on file  Other Topics Concern   Not on file  Social History Narrative   Not on file   Social Determinants of Health   Financial Resource Strain: Not on file  Food Insecurity: Not on file  Transportation Needs: Not on file  Physical Activity: Not on file  Stress: Not on file  Social Connections: Not on file  Intimate Partner Violence: Not on file    Family History  Family history unknown: Yes    No past surgical history on file.  ROS: Review of Systems Negative except as stated above  PHYSICAL EXAM: BP 115/77 (BP Location: Left Arm, Patient Position: Sitting, Cuff Size: Normal)   Pulse 69   Wt 155 lb 9.6 oz (70.6 kg)   SpO2 99%   BMI 26.71 kg/m   Physical Exam   General appearance -  alert, well appearing, and in no distress Mental status - normal mood, behavior, speech, dress, motor activity, and thought processes Neurological - cranial nerves II through XII intact, normal muscle tone, no tremors, strength 5/5.  Decreased sensation on the right lateral lower leg about mid calf.  Of note patient has a large healed scar in this area from a childhood injury. Gait was a little off when he walked without his socks on.  However gait observed as he was exiting the clinic area and was noted to be stable and steady. Extremities -legs are warm.  He has good pulses in the feet. Skin -skin on the trunk is dry.  He has some hyperpigmented discoloration over the posterior trunk.  See picture below.      Latest  Ref Rng & Units 10/30/2022    4:30 PM 04/03/2021   12:00 PM 12/16/2018    5:16 PM  CMP  Glucose 70 - 99 mg/dL 89  83    BUN 6 - 20 mg/dL 5  8    Creatinine 5.63 - 1.27 mg/dL 8.75  6.43    Sodium 329 - 144 mmol/L 141  141    Potassium 3.5 - 5.2 mmol/L 4.3  4.4    Chloride 96 - 106 mmol/L 106  103    CO2 20 - 29 mmol/L 21  21    Calcium 8.7 - 10.2 mg/dL 9.5  9.1    Total Protein 6.0 - 8.5 g/dL 7.5  7.2  7.2   Total Bilirubin 0.0 - 1.2 mg/dL 1.0  0.9  1.4   Alkaline Phos 44 - 121 IU/L 61  59  59   AST 0 - 40 IU/L 27  22  33   ALT 0 - 44 IU/L 24  14  21     Lipid Panel     Component Value Date/Time   CHOL 133 04/03/2021 1200   TRIG 61 04/03/2021 1200   HDL 53 04/03/2021 1200   CHOLHDL 2.5 04/03/2021 1200   LDLCALC 67 04/03/2021 1200   Lab Results  Component Value Date   TSH 4.690 (H) 10/30/2022    CBC    Component Value Date/Time   WBC 2.7 (L) 10/30/2022 1630   WBC 3.4 (L) 12/16/2018 1656   RBC 5.60 10/30/2022 1630   RBC 5.64 12/16/2018 1656   HGB 16.6 10/30/2022 1630   HCT 48.4 10/30/2022 1630   PLT 173 10/30/2022 1630   MCV 86 10/30/2022 1630   MCH 29.6 10/30/2022 1630   MCH 29.1 12/16/2018 1656   MCHC 34.3 10/30/2022 1630   MCHC 33.4 12/16/2018 1656   RDW 13.4 10/30/2022 1630   LYMPHSABS 0.9 10/30/2022 1630   MONOABS 0.3 10/23/2018 1629   EOSABS 0.1 10/30/2022 1630   BASOSABS 0.0 10/30/2022 1630    ASSESSMENT AND PLAN: 1. Migraine without aura and without status migrainosus, not intractable Patient with history of migraine presenting with increased frequency of headaches over the past 2 to 3 months. Will get CT of the head. Advised to change from Tylenol to ibuprofen and try not to take it more than 3 days a week.  Restart Topamax. - Ambulatory referral to Neurology - CT HEAD WO CONTRAST ( ); Future - topiramate (TOPAMAX) 25 MG tablet; 1 tab PO at bedtime to decrease frequency of headaches  Dispense: 30 tablet; Refill: 2  2. Dermatitis Tinea versicolor  versus other type of dermatitis.  Will refer to dermatology.  In the meantime we will try him with ketoconazole and  triamcinolone. - ketoconazole (NIZORAL) 2 % cream; Apply to affected area daily.  Dispense: 30 g; Refill: 0 - triamcinolone cream (KENALOG) 0.1 %; Apply to affected area daily as needed  Dispense: 30 g; Refill: 0  3. Numbness in both legs Of questionable etiology.  We will do some baseline blood tests including B12 and folate level and chemistry to check blood sugar. - Ambulatory referral to Neurology - Vitamin B12 - Folate - Basic Metabolic Panel  4. Abnormal TSH - TSH+T4F+T3Free  5. Need for immunization against influenza - Flu vaccine trivalent PF, 6mos and older(Flulaval,Afluria,Fluarix,Fluzone)    Patient was given the opportunity to ask questions.  Patient verbalized understanding of the plan and was able to repeat key elements of the plan.   This documentation was completed using Paediatric nurse.  Any transcriptional errors are unintentional.  Orders Placed This Encounter  Procedures   Flu vaccine trivalent PF, 6mos and older(Flulaval,Afluria,Fluarix,Fluzone)     Requested Prescriptions    No prescriptions requested or ordered in this encounter    No follow-ups on file.  Jonah Blue, MD, FACP

## 2023-05-10 LAB — BASIC METABOLIC PANEL
BUN/Creatinine Ratio: 12 (ref 9–20)
BUN: 7 mg/dL (ref 6–20)
CO2: 21 mmol/L (ref 20–29)
Calcium: 9.4 mg/dL (ref 8.7–10.2)
Chloride: 105 mmol/L (ref 96–106)
Creatinine, Ser: 0.58 mg/dL — ABNORMAL LOW (ref 0.76–1.27)
Glucose: 73 mg/dL (ref 70–99)
Potassium: 4.3 mmol/L (ref 3.5–5.2)
Sodium: 142 mmol/L (ref 134–144)
eGFR: 131 mL/min/{1.73_m2} (ref >59)

## 2023-05-10 LAB — TSH+T4F+T3FREE
Free T4: 1.45 ng/dL (ref 0.82–1.77)
T3, Free: 3.5 pg/mL (ref 2.0–4.4)
TSH: 2.61 u[IU]/mL (ref 0.450–4.500)

## 2023-05-10 LAB — FOLATE: Folate: 11.5 ng/mL (ref >3.0)

## 2023-05-10 LAB — VITAMIN B12: Vitamin B-12: 250 pg/mL (ref 232–1245)

## 2023-05-10 NOTE — Progress Notes (Signed)
Let patient know that kidney function is good.  Blood sugar level good.  Thyroid function test normal.  Vitamin B12 level is in the low normal range.  I think the low vitamin B12 is causing the intermittent numbness in his legs.  Please give him an appointment with the nurse to have a vitamin B12 shot one time.  He should purchase vitamin B 12 1000 mcg over the counter and take one tablet daily.

## 2023-05-12 ENCOUNTER — Encounter: Payer: Self-pay | Admitting: Neurology

## 2023-05-13 ENCOUNTER — Other Ambulatory Visit: Payer: Self-pay

## 2023-05-13 ENCOUNTER — Ambulatory Visit: Payer: Commercial Managed Care - PPO | Attending: Internal Medicine

## 2023-05-13 DIAGNOSIS — E538 Deficiency of other specified B group vitamins: Secondary | ICD-10-CM

## 2023-05-13 DIAGNOSIS — E539 Vitamin B deficiency, unspecified: Secondary | ICD-10-CM

## 2023-05-13 MED ORDER — CYANOCOBALAMIN 1000 MCG/ML IJ SOLN
1000.0000 ug | Freq: Once | INTRAMUSCULAR | Status: AC
  Administered 2023-05-13: 1000 ug via INTRAMUSCULAR

## 2023-05-13 NOTE — Progress Notes (Signed)
Vit b 12 administered  per protocols.  Patient denies and pain or discomfort at injection site. Tolerated injection well no reaction.

## 2023-05-13 NOTE — Telephone Encounter (Signed)
Noted! Thank you

## 2023-05-13 NOTE — Telephone Encounter (Signed)
Gave verbal: 3g to the affected area once daily, #30g with no refills.

## 2023-06-20 NOTE — Progress Notes (Deleted)
Initial neurology clinic note  Reason for Evaluation: Consultation requested by Marcine Matar, MD for an opinion regarding headache and leg numbness and weakness. My final recommendations will be communicated back to the requesting physician by way of shared medical record or letter to requesting physician via [place] mail.  HPI: This is [name], a 32 y.o. ***-handed male with a medical history of chronic leukopenia, chronic low back pain, migraines who presents to neurology clinic with the chief complaint of headache and leg numbness and weakness. The patient is accompanied by ***.  ***  Numbness and weakness in legs if he sits or stands for 15-20 minutes From knees down Present for ~5 months Can last 5-10 minutes Goes away when he starts walking  Also daily HA for 4-5 months Frontal - left side No vision changes, photophobia, phonophobia, tearing, sinus congestion Can last hours without treat Will resolve in 20-30 minutes with tylenol About 3 days per week he takes tylenol Has a hx of migraines Has been on topamax in the past which was working but not taking when seen by PCP 05/09/23 Topamax was restarted on 05/09/23 (25 mg at bedtime)  Labs were significant for borderline low B12 (250). On supplementation?***  Also on gabapentin?***  CT head ordered by PCP on 05/09/23 but not scheduled or completed***   The patient has not*** had similar episodes of symptoms in the past. ***  Muscle bulk loss? *** Muscle pain? ***  Cramps/Twitching? *** Suggestion of myotonia/difficulty relaxing after contraction? ***  Fatigable weakness?*** Does strength improve after brief exercise?***  Able to brush hair/teeth without difficulty? *** Able to button shirts/use zips? *** Clumsiness/dropping grasped objects?*** Can you arise from squatted position easily? *** Able to get out of chair without using arms? *** Able to walk up steps easily? *** Use an assistive device to  walk? *** Significant imbalance with walking? *** Falls?*** Any change in urine color, especially after exertion/physical activity? ***  The patient denies*** symptoms suggestive of oculobulbar weakness including diplopia, ptosis, dysphagia, poor saliva control, dysarthria/dysphonia, impaired mastication, facial weakness/droop.  There are no*** neuromuscular respiratory weakness symptoms, particularly orthopnea>dyspnea.   Pseudobulbar affect is absent***.  The patient does not*** report symptoms referable to autonomic dysfunction including impaired sweating, heat or cold intolerance, excessive mucosal dryness, gastroparetic early satiety, postprandial abdominal bloating, constipation, bowel or bladder dyscontrol, erectile dysfunction*** or syncope/presyncope/orthostatic intolerance.  There are no*** complaints relating to other symptoms of small fiber modalities including paresthesia/pain.  The patient has not *** noticed any recent skin rashes nor does he*** report any constitutional symptoms like fever, night sweats, anorexia or unintentional weight loss.  EtOH use: ***  Restrictive diet? *** Smoker? Caffeine? Family history of neuropathy/myopathy/NM disease?***  Previous labs, electrodiagnostics, and neuroimaging are summarized below, but pertinent findings include***  Any biopsy done? *** Current medications being tried for the patient's symptoms include ***  Prior medications that have been tried: ***   MEDICATIONS:  Outpatient Encounter Medications as of 07/04/2023  Medication Sig   gabapentin (NEURONTIN) 300 MG capsule Take 1 capsule (300 mg total) by mouth 3 (three) times daily. (Patient not taking: Reported on 05/09/2023)   ketoconazole (NIZORAL) 2 % cream Apply to affected area daily.   topiramate (TOPAMAX) 25 MG tablet 1 tab PO at bedtime to decrease frequency of headaches   triamcinolone cream (KENALOG) 0.1 % Apply to affected area daily as needed   No  facility-administered encounter medications on file as of 07/04/2023.  PAST MEDICAL HISTORY: No past medical history on file.  PAST SURGICAL HISTORY: No past surgical history on file.  ALLERGIES: No Known Allergies  FAMILY HISTORY: Family History  Family history unknown: Yes    SOCIAL HISTORY: Social History   Tobacco Use   Smoking status: Never   Smokeless tobacco: Never  Vaping Use   Vaping status: Never Used  Substance Use Topics   Alcohol use: Yes    Comment: socially   Drug use: Never   Social History   Social History Narrative   Not on file     OBJECTIVE: PHYSICAL EXAM: There were no vitals taken for this visit.  General:*** General appearance: Awake and alert. No distress. Cooperative with exam.  Skin: No obvious rash or jaundice. HEENT: Atraumatic. Anicteric. Lungs: Non-labored breathing on room air  Heart: Regular Abdomen: Soft, non tender. Extremities: No edema. No obvious deformity.  Musculoskeletal: No obvious joint swelling. Psych: Affect appropriate.  Neurological: Mental Status: Alert. Speech fluent. No pseudobulbar affect Cranial Nerves: CNII: No RAPD. Visual fields grossly intact. CNIII, IV, VI: PERRL. No nystagmus. EOMI. CN V: Facial sensation intact bilaterally to fine touch. [place] clench strong. Jaw jerk***. CN VII: Facial muscles symmetric and strong. No ptosis at rest or after sustained upgaze***. CN VIII: Hearing grossly intact bilaterally. CN IX: No hypophonia. CN X: Palate elevates symmetrically. CN XI: Full strength shoulder shrug bilaterally. CN XII: Tongue protrusion full and midline. No atrophy or fasciculations. No significant dysarthria*** Motor: Tone is ***. *** fasciculations in *** extremities. *** atrophy. No grip or percussive myotonia.***  Individual muscle group testing (MRC grade out of 5):  Movement     Neck flexion ***    Neck extension ***     Right Left   Shoulder abduction *** ***   Shoulder  adduction *** ***   Shoulder ext rotation *** ***   Shoulder int rotation *** ***   Elbow flexion *** ***   Elbow extension *** ***   Wrist extension *** ***   Wrist flexion *** ***   Finger abduction - FDI *** ***   Finger abduction - ADM *** ***   Finger extension *** ***   Finger distal flexion - 2/3 *** ***   Finger distal flexion - 4/5 *** ***   Thumb flexion - FPL *** ***   Thumb abduction - APB *** ***    Hip flexion *** ***   Hip extension *** ***   Hip adduction *** ***   Hip abduction *** ***   Knee extension *** ***   Knee flexion *** ***   Dorsiflexion *** ***   Plantarflexion *** ***   Inversion *** ***   Eversion *** ***   Great toe extension *** ***   Great toe flexion *** ***     Reflexes:  Right Left   Bicep *** ***   Tricep *** ***   BrRad *** ***   Knee *** ***   Ankle *** ***    Pathological Reflexes: Babinski: *** response bilaterally*** Hoffman: *** Troemner: *** Pectoral: *** Palmomental: *** Facial: *** Midline tap: *** Sensation: Pinprick: *** Vibration: *** Temperature: *** Proprioception: *** Coordination: Intact finger-to- nose-finger bilaterally. Romberg negative.*** Gait: Able to rise from chair with arms crossed unassisted. Normal, narrow-based gait. Able to tandem walk. Able to walk on toes and heels.***  Lab and Test Review: Internal labs: 05/09/23: B12: 250 TSH wnl Folate wnl BMP unremarkable  10/30/22: CBC w/ diff significant for WBC 2.7 (chronic) TSH  mildly elevated at 4.690  External labs: ***  Imaging: MRI lumbar spine wo contrast (04/10/21): FINDINGS: Segmentation:  Normal on the comparison.   Alignment: Straightening of lumbar lordosis appears stable since last year. Subtle levoconvex lumbar spine curvature on the prior radiographs. No significant spondylolisthesis.   Vertebrae: Degenerative marrow signal changes with some endplate marrow edema at L1-L2 appears reactive due to Schmorl's node (series 6,  image 7 and series 7, image 4).   Normal background bone marrow signal. No other marrow edema or acute osseous abnormality. Intact visible sacrum and SI joints.   Conus medullaris and cauda equina: Conus extends to the L1 level. No lower spinal cord or conus signal abnormality.   Paraspinal and other soft tissues: Negative.   Disc levels:   T11-T12: Negative.   T12-L1:  Negative.   L1-L2: Disc desiccation. Mild disc space loss. But no significant disc bulging. No stenosis.   L2-L3:  Negative.   L3-L4:  Negative.   L4-L5: Subtle circumferential disc bulging, mostly far lateral on the left. Mild facet hypertrophy. Mild left L4 neural foraminal stenosis.   L5-S1:  Negative.   IMPRESSION: 1. 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 Suspect this is the symptomatic abnormality.   2. Subtle disc degeneration at L4-L5 with up to mild left L4 neural foraminal stenosis.  CT head wo contrast (10/23/18): FINDINGS: Brain: No evidence of acute infarction, hemorrhage, hydrocephalus, extra-axial collection or mass lesion/mass effect.   Vascular: No hyperdense vessel or unexpected calcification.   Skull: Normal. Negative for fracture or focal lesion.   Sinuses/Orbits: No acute finding.   Other: None   IMPRESSION: Negative non contrasted CT appearance of the brain ***  ASSESSMENT: Kyle Morris is a 32 y.o. male who presents for evaluation of ***. *** has a relevant medical history of ***. *** neurological examination is pertinent for ***. Available diagnostic data is significant for ***. This constellation of symptoms and objective data would most likely localize to ***. ***  PLAN: -Blood work: *** ***  -Return to clinic ***  The impression above as well as the plan as outlined below were extensively discussed with the patient (in the company of ***) who voiced understanding. All questions were answered to their  satisfaction.  The patient was counseled on pertinent fall precautions per the printed material provided today, and as noted under the "Patient Instructions" section below.***  When available, results of the above investigations and possible further recommendations will be communicated to the patient via telephone/MyChart. Patient to call office if not contacted after expected testing turnaround time.   Total time spent reviewing records, interview, history/exam, documentation, and coordination of care on day of encounter:  *** min   Thank you for allowing me to participate in patient's care.  If I can answer any additional questions, I would be pleased to do so.  Jacquelyne Balint, MD   CC: Marcine Matar, MD [address] [place] [address]  CC: Referring provider: Marcine Matar, MD [address] [place],  [place] [address]

## 2023-07-04 ENCOUNTER — Ambulatory Visit: Payer: Commercial Managed Care - PPO | Admitting: Neurology

## 2023-08-07 NOTE — Progress Notes (Signed)
Initial neurology clinic note  Reason for Evaluation: Consultation requested by Marcine Matar, MD for an opinion regarding headache, back pain, and leg numbness and weakness. My final recommendations will be communicated back to the requesting physician by way of shared medical record or letter to requesting physician via [place] mail.  HPI: This is [name], a 33 y.o. right-handed male with a medical history of chronic leukopenia, chronic low back pain, migraines who presents to neurology clinic with the chief complaint of headache, back pain, and leg numbness and weakness. The patient is alone today.  Patient's headaches started about 4-5 years ago in [place]. Patient thought he might have malaria, but he didn't. He has pain in the forehead area. He denies pain in the back of head or neck. It is a throbbing pain. He feels cold and has no appetite when he has a headache and has occasional nausea. He has photophobia and phonophobia. He wants to lay in a dark, quiet room with the covers over him when he has a headache. The headaches last multiple days. He can have one headache that lasts a month or have 4-5 headaches lasting a few days each. When he gets a headache he will take tylenol. It will help for a little bit, but not for a full 6 hours. He takes tylenol every 6 hours, every day.  Patient was on Topamax 25 mg daily for headache prevention. He ran out a couple of weeks ago though. He does not think that makes much difference in his symptoms despite taking everyday.  Patient's labs were significant for borderline low B12 (250). He takes a B12 supplement now daily, but is not sure how much is in it.  Patient also has had low back pain for about 3 years that comes and goes. It occurs once every feel months. It will usually last a whole day when it occurs. When it comes on, he feels stiff and has difficulty bending over. It is a sharp pain. It makes him feel like he cannot walk to  move when it comes on. He mentions not being able to walk and even using the bathroom in his bed. He denies that this is incontinence, but simply that he cannot walk to go to the restroom. It tends to occur more when he is bending. The pain does not radiate into his legs.  He used to take gabapentin for this but has not had it in a while.  He does report objective fevers or clear weight loss.  EtOH use: 1 drink per week  Restrictive diet? Vegetarian Family history of neuropathy/myopathy/neurologic disease/headaches? No   MEDICATIONS:  Outpatient Encounter Medications as of 08/20/2023  Medication Sig   acetaminophen (TYLENOL) 500 MG tablet Take 500 mg by mouth every 6 (six) hours as needed.   gabapentin (NEURONTIN) 300 MG capsule Take 1 capsule (300 mg total) by mouth 3 (three) times daily.   topiramate (TOPAMAX) 25 MG tablet 1 tab PO at bedtime to decrease frequency of headaches   triamcinolone cream (KENALOG) 0.1 % Apply to affected area daily as needed   ketoconazole (NIZORAL) 2 % cream Apply to affected area daily. (Patient not taking: Reported on 08/20/2023)   No facility-administered encounter medications on file as of 08/20/2023.    PAST MEDICAL HISTORY: History reviewed. No pertinent past medical history.  PAST SURGICAL HISTORY: History reviewed. No pertinent surgical history.  ALLERGIES: No Known Allergies  FAMILY HISTORY: Family History  Family history unknown: Yes  SOCIAL HISTORY: Social History   Tobacco Use   Smoking status: Never  Vaping Use   Vaping status: Never Used  Substance Use Topics   Alcohol use: Yes    Comment: socially   Drug use: Never   Social History   Social History Narrative   Are you right handed or left handed? Right   Are you currently employed ?    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   Who lives with you? family   What type of home do you live in: 1 story or 2 story? one    No Caffiene      OBJECTIVE: PHYSICAL EXAM: BP 101/72   Pulse 91   Ht 5\' 6"  (1.676 m)   Wt 151 lb (68.5 kg)   SpO2 97%   BMI 24.37 kg/m   General: General appearance: Awake and alert. No distress. Cooperative with exam.  Skin: No obvious rash or jaundice. HEENT: Atraumatic. Anicteric. Non-dilated fundoscopy: Disc margins clear, no evidence of papilledema Lungs: Non-labored breathing on room air  Heart: Regular. No carotid bruits. Extremities: No edema. Psych: Affect appropriate.  Neurological: Mental Status: Alert. Speech fluent. No pseudobulbar affect Cranial Nerves: CNII: No RAPD. Visual fields grossly intact. CNIII, IV, VI: PERRL. No nystagmus. EOMI. CN V: Facial sensation intact bilaterally to fine touch. CN VII: Facial muscles symmetric and strong. No ptosis at rest. CN VIII: Hearing grossly intact bilaterally. CN IX: No hypophonia. CN X: Palate elevates symmetrically. CN XI: Full strength shoulder shrug bilaterally. CN XII: Tongue protrusion full and midline. No atrophy or fasciculations. No significant dysarthria Motor: Tone is normal.  Individual muscle group testing (MRC grade out of 5):  Movement     Neck flexion 5    Neck extension 5     Right Left   Shoulder abduction 5 5   Elbow flexion 5 5   Elbow extension 5 5   Finger abduction - FDI 5 5   Finger abduction - ADM 5 5   Finger extension 5 5   Finger distal flexion - 2/3 5 5    Finger distal flexion - 4/5 5 5    Thumb flexion - FPL 5 5   Thumb abduction - APB 5 5    Hip flexion 5 5   Hip extension 5 5   Hip adduction 5 5   Hip abduction 5 5   Knee extension 5 5   Knee flexion 5 5   Dorsiflexion 5 5   Plantarflexion 5 5    Reflexes:  Right Left   Bicep 2+ 2+   Tricep 2+ 2+   BrRad 2+ 2+   Knee 2+ 2+   Ankle 2+ 2+    Pathological Reflexes: Babinski: Flexor response bilaterally Hoffman: absent bilaterally Troemner: absent bilaterally Sensation: Pinprick: Intact in all extremities Coordination:  Intact finger-to- nose-finger bilaterally. Gait: Able to rise from chair with arms crossed unassisted. Narrow based antalgic gait. Able to tandem walk. Able to walk on toes and heels.  Lab and Test Review: Internal labs: 05/09/23: B12: 250 TSH wnl Folate wnl BMP unremarkable  10/30/22: CBC w/ diff significant for WBC 2.7 (chronic) TSH mildly elevated at 4.690  Imaging: MRI lumbar spine wo contrast (04/10/21): FINDINGS: Segmentation:  Normal on the comparison.   Alignment: Straightening of lumbar lordosis appears stable since last year. Subtle levoconvex lumbar spine curvature on the prior radiographs. No significant spondylolisthesis.   Vertebrae: Degenerative marrow signal changes with some  endplate marrow edema at L1-L2 appears reactive due to Schmorl's node (series 6, image 7 and series 7, image 4).   Normal background bone marrow signal. No other marrow edema or acute osseous abnormality. Intact visible sacrum and SI joints.   Conus medullaris and cauda equina: Conus extends to the L1 level. No lower spinal cord or conus signal abnormality.   Paraspinal and other soft tissues: Negative.   Disc levels:   T11-T12: Negative.   T12-L1:  Negative.   L1-L2: Disc desiccation. Mild disc space loss. But no significant disc bulging. No stenosis.   L2-L3:  Negative.   L3-L4:  Negative.   L4-L5: Subtle circumferential disc bulging, mostly far lateral on the left. Mild facet hypertrophy. Mild left L4 neural foraminal stenosis.   L5-S1:  Negative.   IMPRESSION: 1. 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 Suspect this is the symptomatic abnormality.   2. Subtle disc degeneration at L4-L5 with up to mild left L4 neural foraminal stenosis.  CT head wo contrast (10/23/18): FINDINGS: Brain: No evidence of acute infarction, hemorrhage, hydrocephalus, extra-axial collection or mass lesion/mass effect.   Vascular: No  hyperdense vessel or unexpected calcification.   Skull: Normal. Negative for fracture or focal lesion.   Sinuses/Orbits: No acute finding.   Other: None   IMPRESSION: Negative non contrasted CT appearance of the brain  ASSESSMENT: Kyle Morris is a 33 y.o. male who presents for evaluation of headaches and low back pain. He has a relevant medical history of chronic leukopenia. His neurological examination is essentially normal today. Available diagnostic data is significant for MRI lumbar spine from 2022 showing Schmorl's nodes.   Patient's headaches are most consistent with migraine without aura. He has at least 4-5 headaches per month, but they last days to weeks and he has a headache most days. He takes tylenol every 6 hours, so there is also a contribution from medication overuse. He has previously tried Topamax for migraine prevention, but this was not effective. Of note, his B12 was borderline low, so he is now on supplementation.  In terms of his back pain, he has a normal neurologic examination and no definitive red flag symptoms. He does mention being unable to walk to the bathroom when he has back pain and will use the bathroom in his bed, but he denies not being able to hold his urine or not be able to feel it. He has symptoms once every couple of months. I wonder if this is due to the Schmorl's nodes seen on MRI from 2022. He does not have obvious radicular symptoms.  PLAN: -For migraines: Migraine prevention:  Will not reorder Topamax as it did not seem to help. Start Nortriptyline 10 mg at bedtime for 1 week, then increase to 20 mg at bedtime Migraine rescue:  Start Sumatriptan 100 mg as needed at headache onset, can repeat after 2 hours if needed Limit use of pain relievers to no more than 2 days out of week to prevent risk of rebound or medication-overuse headache. Keep headache diary B12 1000 mcg daily for borderline low B12  -Orthopaedic referral for back  pain -Will refill gabapentin 300 mg TID  -Return to clinic in 3 months  The impression above as well as the plan as outlined below were extensively discussed with the patient who voiced understanding. All questions were answered to their satisfaction.  When available, results of the above investigations and possible further recommendations will be communicated to the  patient via telephone/MyChart. Patient to call office if not contacted after expected testing turnaround time.   Total time spent reviewing records, interview, history/exam, documentation, and coordination of care on day of encounter:  65 min   Thank you for allowing me to participate in patient's care.  If I can answer any additional questions, I would be pleased to do so.  Jacquelyne Balint, MD   CC: Marcine Matar, MD [address] [place] [address]  CC: Referring provider: Marcine Matar, MD [address] [place],  [place] [address]

## 2023-08-20 ENCOUNTER — Encounter: Payer: Self-pay | Admitting: Neurology

## 2023-08-20 ENCOUNTER — Ambulatory Visit: Payer: Commercial Managed Care - PPO | Admitting: Neurology

## 2023-08-20 VITALS — BP 101/72 | HR 91 | Ht 66.0 in | Wt 151.0 lb

## 2023-08-20 DIAGNOSIS — M5441 Lumbago with sciatica, right side: Secondary | ICD-10-CM

## 2023-08-20 DIAGNOSIS — H53149 Visual discomfort, unspecified: Secondary | ICD-10-CM | POA: Diagnosis not present

## 2023-08-20 DIAGNOSIS — G43009 Migraine without aura, not intractable, without status migrainosus: Secondary | ICD-10-CM | POA: Diagnosis not present

## 2023-08-20 DIAGNOSIS — M545 Low back pain, unspecified: Secondary | ICD-10-CM

## 2023-08-20 DIAGNOSIS — M5146 Schmorl's nodes, lumbar region: Secondary | ICD-10-CM | POA: Diagnosis not present

## 2023-08-20 DIAGNOSIS — G8929 Other chronic pain: Secondary | ICD-10-CM

## 2023-08-20 DIAGNOSIS — F40298 Other specified phobia: Secondary | ICD-10-CM

## 2023-08-20 MED ORDER — GABAPENTIN 300 MG PO CAPS
300.0000 mg | ORAL_CAPSULE | Freq: Three times a day (TID) | ORAL | 3 refills | Status: AC
Start: 2023-08-20 — End: ?

## 2023-08-20 MED ORDER — NORTRIPTYLINE HCL 10 MG PO CAPS: 20.0000 mg | ORAL_CAPSULE | Freq: Every day | ORAL | 5 refills | Status: AC

## 2023-08-20 MED ORDER — SUMATRIPTAN SUCCINATE 100 MG PO TABS: 100.0000 mg | ORAL_TABLET | Freq: Once | ORAL | 5 refills | Status: AC | PRN

## 2023-08-20 NOTE — Patient Instructions (Addendum)
I saw you today for headaches and back pain.   The headaches are likely migraines. For that: Migraine prevention:  Will not reorder Topamax as it did not seem to help. Start Nortriptyline 10 mg at bedtime every day for 1 week, then increase to 20 mg at bedtime every day Migraine rescue:  Start Sumatriptan 100 mg as needed at headache onset, can repeat after 2 hours if needed, but then cannot repeat further Limit use of pain relievers to no more than 2 days out of week to prevent risk of rebound or medication-overuse headache. Keep headache diary  Your B12 was borderline low. Given your symptoms, I would recommend supplementing with B12 1000 mcg daily. This can be bought over the counter at any local drug store or online.   For your back pain, there was some abnormalities noted on MRI of your lumbar spine in 2022. This may be what is causing your pain. I want you to see someone who specializes in the spine to evaluate and help treat you for this. You should receive a phone call to schedule in the next 1-2 weeks. If not, let us know.  I will see you back in about 3 months to re-evaluate your symptoms.  Please let me know if you have any questions or concerns in the meantime.   The physicians and staff at [facility] are committed to providing excellent care. You may receive a survey requesting feedback about your experience at our office. We strive to receive "very good" responses to the survey questions. If you feel that your experience would prevent you from giving the office a "very good " response, please contact our office to try to remedy the situation. We may be reached at [phone]. Thank you for taking the time out of your busy day to complete the survey.  Jacquelyne Balint, MD [facility]  More migraine information: Be aware of common food triggers:  - Caffeine:  coffee, black tea, cola, Mt. Dew  - Chocolate  - Dairy:  aged cheeses (brie, blue, cheddar, gouda, [place],  provolone, [place], Swiss, etc), chocolate milk, buttermilk, sour cream, limit eggs and yogurt  - Nuts, peanut butter  - Alcohol  - Cereals/grains:  FRESH breads (fresh bagels, sourdough, doughnuts), yeast productions  - Processed/canned/aged/cured meats (pre-packaged deli meats, hotdogs)  - MSG/glutamate:  soy sauce, flavor enhancer, pickled/preserved/marinated foods  - Sweeteners:  aspartame (Equal, Nutrasweet).  Sugar and Splenda are okay  - Vegetables:  legumes (lima beans, lentils, snow peas, fava beans, pinto peans, peas, garbanzo beans), sauerkraut, onions, olives, pickles  - Fruit:  avocados, bananas, citrus fruit (orange, lemon, grapefruit), mango  - Other:  Frozen meals, macaroni and cheese Routine exercise Stay adequately hydrated (aim for 64 oz water daily) Keep headache diary Maintain proper stress management Maintain proper sleep hygiene Do not skip meals Consider supplements:  magnesium citrate 400mg  daily, riboflavin 400mg  daily, coenzyme Q10 100mg  three times daily.

## 2023-12-11 NOTE — Progress Notes (Deleted)
 NEUROLOGY FOLLOW UP OFFICE NOTE  Kyle Morris [id number]  Subjective:  Kyle Morris is a 33 y.o. year old right-handed male with a medical history of chronic leukopenia, chronic low back pain, migraines who we last saw on 08/20/23 for headaches and back pain.  To briefly review: 08/20/23: Patient's headaches started about 4-5 years ago in [place]. Patient thought he might have malaria, but he didn't. He has pain in the forehead area. He denies pain in the back of head or neck. It is a throbbing pain. He feels cold and has no appetite when he has a headache and has occasional nausea. He has photophobia and phonophobia. He wants to lay in a dark, quiet room with the covers over him when he has a headache. The headaches last multiple days. He can have one headache that lasts a month or have 4-5 headaches lasting a few days each. When he gets a headache he will take tylenol . It will help for a little bit, but not for a full 6 hours. He takes tylenol  every 6 hours, every day.   Patient was on Topamax  25 mg daily for headache prevention. He ran out a couple of weeks ago though. He does not think that makes much difference in his symptoms despite taking everyday.   Patient's labs were significant for borderline low B12 (250). He takes a B12 supplement now daily, but is not sure how much is in it.   Patient also has had low back pain for about 3 years that comes and goes. It occurs once every feel months. It will usually last a whole day when it occurs. When it comes on, he feels stiff and has difficulty bending over. It is a sharp pain. It makes him feel like he cannot walk to move when it comes on. He mentions not being able to walk and even using the bathroom in his bed. He denies that this is incontinence, but simply that he cannot walk to go to the restroom. It tends to occur more when he is bending. The pain does not radiate into his legs.   He used to take gabapentin  for this but has  not had it in a while.   He does report objective fevers or clear weight loss.   EtOH use: 1 drink per week  Restrictive diet? Vegetarian Family history of neuropathy/myopathy/neurologic disease/headaches? No  Most recent Assessment and Plan (08/20/23): Kyle Morris is a 33 y.o. male who presents for evaluation of headaches and low back pain. He has a relevant medical history of chronic leukopenia. His neurological examination is essentially normal today. Available diagnostic data is significant for MRI lumbar spine from 2022 showing Schmorl's nodes.    Patient's headaches are most consistent with migraine without aura. He has at least 4-5 headaches per month, but they last days to weeks and he has a headache most days. He takes tylenol  every 6 hours, so there is also a contribution from medication overuse. He has previously tried Topamax  for migraine prevention, but this was not effective. Of note, his B12 was borderline low, so he is now on supplementation.   In terms of his back pain, he has a normal neurologic examination and no definitive red flag symptoms. He does mention being unable to walk to the bathroom when he has back pain and will use the bathroom in his bed, but he denies not being able to hold his urine or not be able to feel it. He has symptoms once every  couple of months. I wonder if this is due to the Schmorl's nodes seen on MRI from 2022. He does not have obvious radicular symptoms.   PLAN: -For migraines: Migraine prevention:  Will not reorder Topamax  as it did not seem to help. Start Nortriptyline  10 mg at bedtime for 1 week, then increase to 20 mg at bedtime Migraine rescue:  Start Sumatriptan  100 mg as needed at headache onset, can repeat after 2 hours if needed Limit use of pain relievers to no more than 2 days out of week to prevent risk of rebound or medication-overuse headache. Keep headache diary B12 1000 mcg daily for borderline low B12   -Orthopaedic  referral for back pain -Will refill gabapentin  300 mg TID  Since their last visit: Headaches?   Back pain? Did he see ortho?  ***  MEDICATIONS:  Outpatient Encounter Medications as of 12/19/2023  Medication Sig   acetaminophen  (TYLENOL ) 500 MG tablet Take 500 mg by mouth every 6 (six) hours as needed.   gabapentin  (NEURONTIN ) 300 MG capsule Take 1 capsule (300 mg total) by mouth 3 (three) times daily.   ketoconazole  (NIZORAL ) 2 % cream Apply to affected area daily. (Patient not taking: Reported on 08/20/2023)   nortriptyline  (PAMELOR ) 10 MG capsule Take 2 capsules (20 mg total) by mouth at bedtime. Take 1 capsule (10 mg) at bedtime for 1 week, then increase to 2 capsules (20 mg) at bedtime thereafter   SUMAtriptan  (IMITREX ) 100 MG tablet Take 1 tablet (100 mg total) by mouth once as needed for up to 1 dose. May repeat in 2 hours if headache persists or recurs.   triamcinolone  cream (KENALOG ) 0.1 % Apply to affected area daily as needed   No facility-administered encounter medications on file as of 12/19/2023.    PAST MEDICAL HISTORY: No past medical history on file.  PAST SURGICAL HISTORY: No past surgical history on file.  ALLERGIES: No Known Allergies  FAMILY HISTORY: Family History  Family history unknown: Yes    SOCIAL HISTORY: Social History   Tobacco Use   Smoking status: Never  Vaping Use   Vaping status: Never Used  Substance Use Topics   Alcohol use: Yes    Comment: socially   Drug use: Never   Social History   Social History Narrative   Are you right handed or left handed? Right   Are you currently employed ?    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   Who lives with you? family   What type of home do you live in: 1 story or 2 story? one    No Caffiene      Objective:  Vital Signs:  There were no vitals taken for this visit.  ***  Labs and Imaging review: No new results***  Previously reviewed  results: 05/09/23: B12: 250 TSH wnl Folate wnl BMP unremarkable   10/30/22: CBC w/ diff significant for WBC 2.7 (chronic) TSH mildly elevated at 4.690   Imaging: MRI lumbar spine wo contrast (04/10/21): FINDINGS: Segmentation:  Normal on the comparison.   Alignment: Straightening of lumbar lordosis appears stable since last year. Subtle levoconvex lumbar spine curvature on the prior radiographs. No significant spondylolisthesis.   Vertebrae: Degenerative marrow signal changes with some endplate marrow edema at L1-L2 appears reactive due to Schmorl's node (series 6, image 7 and series 7, image 4).   Normal background bone marrow signal. No other marrow edema or acute osseous abnormality. Intact visible  sacrum and SI joints.   Conus medullaris and cauda equina: Conus extends to the L1 level. No lower spinal cord or conus signal abnormality.   Paraspinal and other soft tissues: Negative.   Disc levels:   T11-T12: Negative.   T12-L1:  Negative.   L1-L2: Disc desiccation. Mild disc space loss. But no significant disc bulging. No stenosis.   L2-L3:  Negative.   L3-L4:  Negative.   L4-L5: Subtle circumferential disc bulging, mostly far lateral on the left. Mild facet hypertrophy. Mild left L4 neural foraminal stenosis.   L5-S1:  Negative.   IMPRESSION: 1. 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 Suspect this is the symptomatic abnormality.   2. Subtle disc degeneration at L4-L5 with up to mild left L4 neural foraminal stenosis.   CT head wo contrast (10/23/18): FINDINGS: Brain: No evidence of acute infarction, hemorrhage, hydrocephalus, extra-axial collection or mass lesion/mass effect.   Vascular: No hyperdense vessel or unexpected calcification.   Skull: Normal. Negative for fracture or focal lesion.   Sinuses/Orbits: No acute finding.   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

## 2023-12-19 ENCOUNTER — Ambulatory Visit: Payer: Commercial Managed Care - PPO | Admitting: Neurology

## 2024-04-20 NOTE — Progress Notes (Deleted)
 NEUROLOGY FOLLOW UP OFFICE NOTE  Kyle Morris [id number]  Subjective:  Kyle Morris is a 33 y.o. year old right-handed male with a medical history of chronic leukopenia, chronic low back pain, migraines who we last saw on 08/20/23 for headaches and back pain.  To briefly review: 08/20/23: Patient's headaches started about 4-5 years ago in [place]. Patient thought he might have malaria, but he didn't. He has pain in the forehead area. He denies pain in the back of head or neck. It is a throbbing pain. He feels cold and has no appetite when he has a headache and has occasional nausea. He has photophobia and phonophobia. He wants to lay in a dark, quiet room with the covers over him when he has a headache. The headaches last multiple days. He can have one headache that lasts a month or have 4-5 headaches lasting a few days each. When he gets a headache he will take tylenol . It will help for a little bit, but not for a full 6 hours. He takes tylenol  every 6 hours, every day.   Patient was on Topamax  25 mg daily for headache prevention. He ran out a couple of weeks ago though. He does not think that makes much difference in his symptoms despite taking everyday.   Patient's labs were significant for borderline low B12 (250). He takes a B12 supplement now daily, but is not sure how much is in it.   Patient also has had low back pain for about 3 years that comes and goes. It occurs once every feel months. It will usually last a whole day when it occurs. When it comes on, he feels stiff and has difficulty bending over. It is a sharp pain. It makes him feel like he cannot walk to move when it comes on. He mentions not being able to walk and even using the bathroom in his bed. He denies that this is incontinence, but simply that he cannot walk to go to the restroom. It tends to occur more when he is bending. The pain does not radiate into his legs.   He used to take gabapentin  for this but has  not had it in a while.   He does report objective fevers or clear weight loss.   EtOH use: 1 drink per week  Restrictive diet? Vegetarian Family history of neuropathy/myopathy/neurologic disease/headaches? No  Most recent Assessment and Plan (08/20/23): Kyle Morris is a 33 y.o. male who presents for evaluation of headaches and low back pain. He has a relevant medical history of chronic leukopenia. His neurological examination is essentially normal today. Available diagnostic data is significant for MRI lumbar spine from 2022 showing Schmorl's nodes.    Patient's headaches are most consistent with migraine without aura. He has at least 4-5 headaches per month, but they last days to weeks and he has a headache most days. He takes tylenol  every 6 hours, so there is also a contribution from medication overuse. He has previously tried Topamax  for migraine prevention, but this was not effective. Of note, his B12 was borderline low, so he is now on supplementation.   In terms of his back pain, he has a normal neurologic examination and no definitive red flag symptoms. He does mention being unable to walk to the bathroom when he has back pain and will use the bathroom in his bed, but he denies not being able to hold his urine or not be able to feel it. He has symptoms once every  couple of months. I wonder if this is due to the Schmorl's nodes seen on MRI from 2022. He does not have obvious radicular symptoms.   PLAN: -For migraines: Migraine prevention:  Will not reorder Topamax  as it did not seem to help. Start Nortriptyline  10 mg at bedtime for 1 week, then increase to 20 mg at bedtime Migraine rescue:  Start Sumatriptan  100 mg as needed at headache onset, can repeat after 2 hours if needed Limit use of pain relievers to no more than 2 days out of week to prevent risk of rebound or medication-overuse headache. Keep headache diary B12 1000 mcg daily for borderline low B12   -Orthopaedic  referral for back pain -Will refill gabapentin  300 mg TID  Since their last visit: Headaches?    Back pain? Did he see ortho?   ***  MEDICATIONS:  Outpatient Encounter Medications as of 04/28/2024  Medication Sig   acetaminophen  (TYLENOL ) 500 MG tablet Take 500 mg by mouth every 6 (six) hours as needed.   gabapentin  (NEURONTIN ) 300 MG capsule Take 1 capsule (300 mg total) by mouth 3 (three) times daily.   ketoconazole  (NIZORAL ) 2 % cream Apply to affected area daily. (Patient not taking: Reported on 08/20/2023)   nortriptyline  (PAMELOR ) 10 MG capsule Take 2 capsules (20 mg total) by mouth at bedtime. Take 1 capsule (10 mg) at bedtime for 1 week, then increase to 2 capsules (20 mg) at bedtime thereafter   SUMAtriptan  (IMITREX ) 100 MG tablet Take 1 tablet (100 mg total) by mouth once as needed for up to 1 dose. May repeat in 2 hours if headache persists or recurs.   triamcinolone  cream (KENALOG ) 0.1 % Apply to affected area daily as needed   No facility-administered encounter medications on file as of 04/28/2024.    PAST MEDICAL HISTORY: No past medical history on file.  PAST SURGICAL HISTORY: No past surgical history on file.  ALLERGIES: No Known Allergies  FAMILY HISTORY: Family History  Family history unknown: Yes    SOCIAL HISTORY: Social History   Tobacco Use   Smoking status: Never  Vaping Use   Vaping status: Never Used  Substance Use Topics   Alcohol use: Yes    Comment: socially   Drug use: Never   Social History   Social History Narrative   Are you right handed or left handed? Right   Are you currently employed ?    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   Who lives with you? family   What type of home do you live in: 1 story or 2 story? one    No Caffiene      Objective:  Vital Signs:  There were no vitals taken for this visit.  ***  Labs and Imaging review: No new results***   Previously reviewed  results: 05/09/23: B12: 250 TSH wnl Folate wnl BMP unremarkable   10/30/22: CBC w/ diff significant for WBC 2.7 (chronic) TSH mildly elevated at 4.690   Imaging: MRI lumbar spine wo contrast (04/10/21): FINDINGS: Segmentation:  Normal on the comparison.   Alignment: Straightening of lumbar lordosis appears stable since last year. Subtle levoconvex lumbar spine curvature on the prior radiographs. No significant spondylolisthesis.   Vertebrae: Degenerative marrow signal changes with some endplate marrow edema at L1-L2 appears reactive due to Schmorl's node (series 6, image 7 and series 7, image 4).   Normal background bone marrow signal. No other marrow edema or acute osseous  abnormality. Intact visible sacrum and SI joints.   Conus medullaris and cauda equina: Conus extends to the L1 level. No lower spinal cord or conus signal abnormality.   Paraspinal and other soft tissues: Negative.   Disc levels:   T11-T12: Negative.   T12-L1:  Negative.   L1-L2: Disc desiccation. Mild disc space loss. But no significant disc bulging. No stenosis.   L2-L3:  Negative.   L3-L4:  Negative.   L4-L5: Subtle circumferential disc bulging, mostly far lateral on the left. Mild facet hypertrophy. Mild left L4 neural foraminal stenosis.   L5-S1:  Negative.   IMPRESSION: 1. 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 Suspect this is the symptomatic abnormality.   2. Subtle disc degeneration at L4-L5 with up to mild left L4 neural foraminal stenosis.   CT head wo contrast (10/23/18): FINDINGS: Brain: No evidence of acute infarction, hemorrhage, hydrocephalus, extra-axial collection or mass lesion/mass effect.   Vascular: No hyperdense vessel or unexpected calcification.   Skull: Normal. Negative for fracture or focal lesion.   Sinuses/Orbits: No acute finding.   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

## 2024-04-28 ENCOUNTER — Encounter: Payer: Self-pay | Admitting: Neurology

## 2024-04-28 ENCOUNTER — Ambulatory Visit: Admitting: Neurology
